# Patient Record
Sex: Male | Born: 1960 | Race: Black or African American | Hispanic: No | Smoking: Never smoker
Health system: Southern US, Community
[De-identification: ages and names within clinical notes are randomized; demographics above are authoritative.]

## PROBLEM LIST (undated history)

## (undated) DIAGNOSIS — M25569 Pain in unspecified knee: Secondary | ICD-10-CM

## (undated) DIAGNOSIS — F1420 Cocaine dependence, uncomplicated: Secondary | ICD-10-CM

## (undated) DIAGNOSIS — G8929 Other chronic pain: Secondary | ICD-10-CM

## (undated) DIAGNOSIS — F4310 Post-traumatic stress disorder, unspecified: Secondary | ICD-10-CM

## (undated) DIAGNOSIS — M549 Dorsalgia, unspecified: Secondary | ICD-10-CM

## (undated) DIAGNOSIS — A159 Respiratory tuberculosis unspecified: Secondary | ICD-10-CM

## (undated) HISTORY — PX: KNEE SURGERY: SHX244

---

## 1993-08-14 DIAGNOSIS — A159 Respiratory tuberculosis unspecified: Secondary | ICD-10-CM

## 1993-08-14 HISTORY — DX: Respiratory tuberculosis unspecified: A15.9

## 2000-03-20 ENCOUNTER — Emergency Department (HOSPITAL_COMMUNITY): Admission: EM | Admit: 2000-03-20 | Discharge: 2000-03-20 | Payer: Self-pay | Admitting: Emergency Medicine

## 2000-03-20 ENCOUNTER — Encounter: Payer: Self-pay | Admitting: Emergency Medicine

## 2000-03-22 ENCOUNTER — Emergency Department (HOSPITAL_COMMUNITY): Admission: EM | Admit: 2000-03-22 | Discharge: 2000-03-22 | Payer: Self-pay | Admitting: Emergency Medicine

## 2000-04-16 ENCOUNTER — Emergency Department (HOSPITAL_COMMUNITY): Admission: EM | Admit: 2000-04-16 | Discharge: 2000-04-16 | Payer: Self-pay | Admitting: Emergency Medicine

## 2009-11-14 ENCOUNTER — Encounter: Payer: Self-pay | Admitting: Sports Medicine

## 2009-11-14 ENCOUNTER — Emergency Department (HOSPITAL_COMMUNITY): Admission: EM | Admit: 2009-11-14 | Discharge: 2009-11-14 | Payer: Self-pay | Admitting: Emergency Medicine

## 2009-11-24 ENCOUNTER — Ambulatory Visit: Payer: Self-pay | Admitting: Internal Medicine

## 2009-11-24 ENCOUNTER — Encounter (INDEPENDENT_AMBULATORY_CARE_PROVIDER_SITE_OTHER): Payer: Self-pay | Admitting: Family Medicine

## 2009-11-24 LAB — CONVERTED CEMR LAB
Amphetamine Screen, Ur: NEGATIVE
Barbiturate Quant, Ur: NEGATIVE
Benzodiazepines.: NEGATIVE
Cocaine Metabolites: NEGATIVE
Creatinine,U: 122.1 mg/dL
Marijuana Metabolite: NEGATIVE
Methadone: NEGATIVE
Opiate Screen, Urine: NEGATIVE
Phencyclidine (PCP): NEGATIVE
Propoxyphene: NEGATIVE

## 2009-12-21 ENCOUNTER — Encounter (INDEPENDENT_AMBULATORY_CARE_PROVIDER_SITE_OTHER): Payer: Self-pay | Admitting: Family Medicine

## 2009-12-21 ENCOUNTER — Ambulatory Visit: Payer: Self-pay | Admitting: Internal Medicine

## 2009-12-21 LAB — CONVERTED CEMR LAB
ALT: 20 units/L (ref 0–53)
AST: 17 units/L (ref 0–37)
Albumin: 4.2 g/dL (ref 3.5–5.2)
Alkaline Phosphatase: 95 units/L (ref 39–117)
BUN: 13 mg/dL (ref 6–23)
Basophils Absolute: 0 10*3/uL (ref 0.0–0.1)
Basophils Relative: 0 % (ref 0–1)
CO2: 22 meq/L (ref 19–32)
Calcium: 9.2 mg/dL (ref 8.4–10.5)
Chloride: 105 meq/L (ref 96–112)
Cholesterol: 131 mg/dL (ref 0–200)
Creatinine, Ser: 1.16 mg/dL (ref 0.40–1.50)
Eosinophils Absolute: 0.1 10*3/uL (ref 0.0–0.7)
Eosinophils Relative: 2 % (ref 0–5)
Glucose, Bld: 95 mg/dL (ref 70–99)
HCT: 41.5 % (ref 39.0–52.0)
HDL: 56 mg/dL (ref >39)
Helicobacter Pylori Antibody-IgG: >8 — ABNORMAL HIGH
Hemoglobin: 13.5 g/dL (ref 13.0–17.0)
LDL Cholesterol: 47 mg/dL (ref 0–99)
Lymphocytes Relative: 32 % (ref 12–46)
Lymphs Abs: 1.5 10*3/uL (ref 0.7–4.0)
MCHC: 32.5 g/dL (ref 30.0–36.0)
MCV: 94.1 fL (ref 78.0–100.0)
Monocytes Absolute: 0.3 10*3/uL (ref 0.1–1.0)
Monocytes Relative: 6 % (ref 3–12)
Neutro Abs: 2.8 10*3/uL (ref 1.7–7.7)
Neutrophils Relative %: 60 % (ref 43–77)
Platelets: 233 10*3/uL (ref 150–400)
Potassium: 4.3 meq/L (ref 3.5–5.3)
RBC: 4.41 M/uL (ref 4.22–5.81)
RDW: 14.7 % (ref 11.5–15.5)
Sodium: 140 meq/L (ref 135–145)
Total Bilirubin: 0.3 mg/dL (ref 0.3–1.2)
Total CHOL/HDL Ratio: 2.3
Total Protein: 6.8 g/dL (ref 6.0–8.3)
Triglycerides: 138 mg/dL (ref <150)
VLDL: 28 mg/dL (ref 0–40)
Vit D, 25-Hydroxy: 29 ng/mL — ABNORMAL LOW (ref 30–89)
WBC: 4.7 10*3/uL (ref 4.0–10.5)

## 2009-12-22 ENCOUNTER — Ambulatory Visit: Payer: Self-pay | Admitting: Sports Medicine

## 2009-12-22 DIAGNOSIS — M25569 Pain in unspecified knee: Secondary | ICD-10-CM

## 2009-12-22 DIAGNOSIS — S82009A Unspecified fracture of unspecified patella, initial encounter for closed fracture: Secondary | ICD-10-CM

## 2010-02-15 ENCOUNTER — Ambulatory Visit: Payer: Self-pay | Admitting: Internal Medicine

## 2010-02-15 ENCOUNTER — Encounter (INDEPENDENT_AMBULATORY_CARE_PROVIDER_SITE_OTHER): Payer: Self-pay | Admitting: Family Medicine

## 2010-02-15 LAB — CONVERTED CEMR LAB
Chlamydia, Swab/Urine, PCR: NEGATIVE
GC Probe Amp, Urine: NEGATIVE

## 2010-08-09 ENCOUNTER — Encounter: Payer: Self-pay | Admitting: Family Medicine

## 2010-09-15 NOTE — Letter (Signed)
Summary: [facility]   Imported By: Marily Memos 08/15/2010 10:00:00  _____________________________________________________________________  External Attachment:    Type:   Image     Comment:   External Document
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
## 2011-03-08 ENCOUNTER — Emergency Department (HOSPITAL_COMMUNITY)
Admission: EM | Admit: 2011-03-08 | Discharge: 2011-03-08 | Disposition: A | Discharge status: Home / Self Care | Payer: Medicaid Other | Attending: Emergency Medicine | Admitting: Emergency Medicine

## 2011-03-08 ENCOUNTER — Emergency Department (HOSPITAL_COMMUNITY): Payer: Medicaid Other

## 2011-03-08 DIAGNOSIS — M79609 Pain in unspecified limb: Secondary | ICD-10-CM | POA: Insufficient documentation

## 2011-03-08 DIAGNOSIS — F3289 Other specified depressive episodes: Secondary | ICD-10-CM | POA: Insufficient documentation

## 2011-03-08 DIAGNOSIS — K219 Gastro-esophageal reflux disease without esophagitis: Secondary | ICD-10-CM | POA: Insufficient documentation

## 2011-03-08 DIAGNOSIS — R609 Edema, unspecified: Secondary | ICD-10-CM | POA: Insufficient documentation

## 2011-03-08 DIAGNOSIS — M25476 Effusion, unspecified foot: Secondary | ICD-10-CM | POA: Insufficient documentation

## 2011-03-08 DIAGNOSIS — R0602 Shortness of breath: Secondary | ICD-10-CM | POA: Insufficient documentation

## 2011-03-08 DIAGNOSIS — M25473 Effusion, unspecified ankle: Secondary | ICD-10-CM | POA: Insufficient documentation

## 2011-03-08 DIAGNOSIS — M7989 Other specified soft tissue disorders: Secondary | ICD-10-CM | POA: Insufficient documentation

## 2011-03-08 DIAGNOSIS — F329 Major depressive disorder, single episode, unspecified: Secondary | ICD-10-CM | POA: Insufficient documentation

## 2011-03-08 LAB — CBC
Hemoglobin: 14.4 g/dL (ref 13.0–17.0)
MCV: 89 fL (ref 78.0–100.0)
Platelets: 224 10*3/uL (ref 150–400)
RBC: 4.56 MIL/uL (ref 4.22–5.81)
WBC: 6.3 10*3/uL (ref 4.0–10.5)

## 2011-03-08 LAB — DIFFERENTIAL
Eosinophils Absolute: 0.3 10*3/uL (ref 0.0–0.7)
Lymphs Abs: 1.5 10*3/uL (ref 0.7–4.0)
Neutro Abs: 4 10*3/uL (ref 1.7–7.7)
Neutrophils Relative %: 63 % (ref 43–77)

## 2011-03-08 LAB — COMPREHENSIVE METABOLIC PANEL
ALT: 22 U/L (ref 0–53)
AST: 18 U/L (ref 0–37)
CO2: 28 mEq/L (ref 19–32)
Chloride: 101 mEq/L (ref 96–112)
Creatinine, Ser: 1 mg/dL (ref 0.50–1.35)
GFR calc non Af Amer: >60 mL/min (ref >60)
Sodium: 137 mEq/L (ref 135–145)
Total Bilirubin: 0.4 mg/dL (ref 0.3–1.2)

## 2011-03-09 ENCOUNTER — Emergency Department (HOSPITAL_COMMUNITY): Payer: No Typology Code available for payment source

## 2011-03-09 ENCOUNTER — Emergency Department (HOSPITAL_COMMUNITY)
Admission: EM | Admit: 2011-03-09 | Discharge: 2011-03-09 | Disposition: A | Discharge status: Home / Self Care | Payer: No Typology Code available for payment source | Attending: Emergency Medicine | Admitting: Emergency Medicine

## 2011-03-09 DIAGNOSIS — S0990XA Unspecified injury of head, initial encounter: Secondary | ICD-10-CM | POA: Insufficient documentation

## 2011-03-09 DIAGNOSIS — M542 Cervicalgia: Secondary | ICD-10-CM | POA: Insufficient documentation

## 2011-03-09 DIAGNOSIS — M549 Dorsalgia, unspecified: Secondary | ICD-10-CM | POA: Insufficient documentation

## 2011-03-09 DIAGNOSIS — IMO0002 Reserved for concepts with insufficient information to code with codable children: Secondary | ICD-10-CM | POA: Insufficient documentation

## 2011-03-11 ENCOUNTER — Emergency Department (HOSPITAL_COMMUNITY): Payer: No Typology Code available for payment source

## 2011-03-11 ENCOUNTER — Emergency Department (HOSPITAL_COMMUNITY)
Admission: EM | Admit: 2011-03-11 | Discharge: 2011-03-11 | Disposition: A | Discharge status: Home / Self Care | Payer: No Typology Code available for payment source | Attending: Emergency Medicine | Admitting: Emergency Medicine

## 2011-03-11 DIAGNOSIS — IMO0002 Reserved for concepts with insufficient information to code with codable children: Secondary | ICD-10-CM | POA: Insufficient documentation

## 2011-03-11 DIAGNOSIS — R0609 Other forms of dyspnea: Secondary | ICD-10-CM | POA: Insufficient documentation

## 2011-03-11 DIAGNOSIS — M549 Dorsalgia, unspecified: Secondary | ICD-10-CM | POA: Insufficient documentation

## 2011-03-11 DIAGNOSIS — R609 Edema, unspecified: Secondary | ICD-10-CM | POA: Insufficient documentation

## 2011-03-11 DIAGNOSIS — R011 Cardiac murmur, unspecified: Secondary | ICD-10-CM | POA: Insufficient documentation

## 2011-03-11 DIAGNOSIS — M79609 Pain in unspecified limb: Secondary | ICD-10-CM | POA: Insufficient documentation

## 2011-03-11 DIAGNOSIS — R071 Chest pain on breathing: Secondary | ICD-10-CM | POA: Insufficient documentation

## 2011-03-11 DIAGNOSIS — R0989 Other specified symptoms and signs involving the circulatory and respiratory systems: Secondary | ICD-10-CM | POA: Insufficient documentation

## 2011-03-11 LAB — BASIC METABOLIC PANEL
BUN: 12 mg/dL (ref 6–23)
CO2: 25 mEq/L (ref 19–32)
Chloride: 106 mEq/L (ref 96–112)
Creatinine, Ser: 1 mg/dL (ref 0.50–1.35)

## 2011-03-11 LAB — CBC
MCH: 30.4 pg (ref 26.0–34.0)
MCHC: 33.9 g/dL (ref 30.0–36.0)
Platelets: 243 10*3/uL (ref 150–400)

## 2011-03-11 LAB — DIFFERENTIAL
Basophils Relative: 0 % (ref 0–1)
Eosinophils Absolute: 0.5 10*3/uL (ref 0.0–0.7)
Eosinophils Relative: 7 % — ABNORMAL HIGH (ref 0–5)
Monocytes Absolute: 0.6 10*3/uL (ref 0.1–1.0)
Monocytes Relative: 9 % (ref 3–12)

## 2011-03-11 LAB — TROPONIN I: Troponin I: <0.3 ng/mL (ref <0.30)

## 2011-03-11 LAB — D-DIMER, QUANTITATIVE: D-Dimer, Quant: 0.53 ug/mL-FEU — ABNORMAL HIGH (ref 0.00–0.48)

## 2011-03-11 MED ORDER — IOHEXOL 300 MG/ML  SOLN: 100.0000 mL | Freq: Once | INTRAMUSCULAR | Status: DC | PRN

## 2011-03-22 ENCOUNTER — Emergency Department (HOSPITAL_COMMUNITY)
Admission: EM | Admit: 2011-03-22 | Discharge: 2011-03-22 | Disposition: A | Discharge status: Home / Self Care | Payer: No Typology Code available for payment source | Attending: Emergency Medicine | Admitting: Emergency Medicine

## 2011-03-22 DIAGNOSIS — M545 Low back pain, unspecified: Secondary | ICD-10-CM | POA: Insufficient documentation

## 2011-03-22 DIAGNOSIS — IMO0002 Reserved for concepts with insufficient information to code with codable children: Secondary | ICD-10-CM | POA: Insufficient documentation

## 2011-03-22 DIAGNOSIS — F3130 Bipolar disorder, current episode depressed, mild or moderate severity, unspecified: Secondary | ICD-10-CM | POA: Insufficient documentation

## 2011-03-22 DIAGNOSIS — S20229A Contusion of unspecified back wall of thorax, initial encounter: Secondary | ICD-10-CM | POA: Insufficient documentation

## 2011-03-22 DIAGNOSIS — Z79899 Other long term (current) drug therapy: Secondary | ICD-10-CM | POA: Insufficient documentation

## 2012-03-16 ENCOUNTER — Inpatient Hospital Stay (HOSPITAL_COMMUNITY)
Admission: EM | Admit: 2012-03-16 | Discharge: 2012-03-18 | DRG: 204 | Payer: Medicaid Other | Attending: Internal Medicine | Admitting: Internal Medicine

## 2012-03-16 ENCOUNTER — Encounter (HOSPITAL_COMMUNITY): Payer: Self-pay | Admitting: *Deleted

## 2012-03-16 ENCOUNTER — Emergency Department (HOSPITAL_COMMUNITY): Payer: Medicaid Other

## 2012-03-16 ENCOUNTER — Other Ambulatory Visit (HOSPITAL_COMMUNITY): Payer: Medicaid Other

## 2012-03-16 ENCOUNTER — Inpatient Hospital Stay (HOSPITAL_COMMUNITY): Payer: Medicaid Other

## 2012-03-16 DIAGNOSIS — J069 Acute upper respiratory infection, unspecified: Secondary | ICD-10-CM | POA: Diagnosis present

## 2012-03-16 DIAGNOSIS — Z5900 Homelessness unspecified: Secondary | ICD-10-CM

## 2012-03-16 DIAGNOSIS — R05 Cough: Secondary | ICD-10-CM

## 2012-03-16 DIAGNOSIS — F4310 Post-traumatic stress disorder, unspecified: Secondary | ICD-10-CM | POA: Diagnosis present

## 2012-03-16 DIAGNOSIS — F329 Major depressive disorder, single episode, unspecified: Secondary | ICD-10-CM | POA: Diagnosis present

## 2012-03-16 DIAGNOSIS — F1410 Cocaine abuse, uncomplicated: Secondary | ICD-10-CM | POA: Diagnosis present

## 2012-03-16 DIAGNOSIS — Z8611 Personal history of tuberculosis: Secondary | ICD-10-CM

## 2012-03-16 DIAGNOSIS — F1020 Alcohol dependence, uncomplicated: Secondary | ICD-10-CM | POA: Diagnosis present

## 2012-03-16 DIAGNOSIS — F3289 Other specified depressive episodes: Secondary | ICD-10-CM | POA: Diagnosis present

## 2012-03-16 DIAGNOSIS — R071 Chest pain on breathing: Principal | ICD-10-CM | POA: Diagnosis present

## 2012-03-16 DIAGNOSIS — R079 Chest pain, unspecified: Secondary | ICD-10-CM

## 2012-03-16 HISTORY — DX: Respiratory tuberculosis unspecified: A15.9

## 2012-03-16 HISTORY — DX: Post-traumatic stress disorder, unspecified: F43.10

## 2012-03-16 LAB — CBC
HCT: 44.4 % (ref 39.0–52.0)
MCV: 89.5 fL (ref 78.0–100.0)
RDW: 14.2 % (ref 11.5–15.5)
WBC: 7.2 10*3/uL (ref 4.0–10.5)

## 2012-03-16 LAB — COMPREHENSIVE METABOLIC PANEL
Albumin: 3.9 g/dL (ref 3.5–5.2)
BUN: 10 mg/dL (ref 6–23)
Chloride: 101 mEq/L (ref 96–112)
Creatinine, Ser: 1.05 mg/dL (ref 0.50–1.35)
GFR calc Af Amer: >90 mL/min (ref >90)
GFR calc non Af Amer: 80 mL/min — ABNORMAL LOW (ref >90)
Glucose, Bld: 93 mg/dL (ref 70–99)
Total Bilirubin: 0.5 mg/dL (ref 0.3–1.2)

## 2012-03-16 LAB — POCT I-STAT TROPONIN I: Troponin i, poc: 0 ng/mL (ref 0.00–0.08)

## 2012-03-16 LAB — CARDIAC PANEL(CRET KIN+CKTOT+MB+TROPI)
CK, MB: 4.3 ng/mL — ABNORMAL HIGH (ref 0.3–4.0)
CK, MB: 3.6 ng/mL (ref 0.3–4.0)
Troponin I: <0.3 ng/mL (ref <0.30)

## 2012-03-16 LAB — ETHANOL: Alcohol, Ethyl (B): 13 mg/dL — ABNORMAL HIGH (ref 0–11)

## 2012-03-16 MED ORDER — ACETAMINOPHEN 650 MG RE SUPP: 650.0000 mg | Freq: Four times a day (QID) | RECTAL | Status: DC | PRN

## 2012-03-16 MED ORDER — VITAMIN B-1 100 MG PO TABS
100.0000 mg | ORAL_TABLET | Freq: Every day | ORAL | Status: DC
  Administered 2012-03-16 – 2012-03-18 (×3): 100 mg via ORAL
  Filled 2012-03-16 (×3): qty 1

## 2012-03-16 MED ORDER — ASPIRIN 81 MG PO CHEW
CHEWABLE_TABLET | ORAL | Status: AC
  Administered 2012-03-16: 81 mg
  Filled 2012-03-16: qty 1

## 2012-03-16 MED ORDER — ASPIRIN EC 81 MG PO TBEC
81.0000 mg | DELAYED_RELEASE_TABLET | Freq: Every day | ORAL | Status: DC
  Administered 2012-03-17 – 2012-03-18 (×2): 81 mg via ORAL
  Filled 2012-03-16 (×2): qty 1

## 2012-03-16 MED ORDER — PANTOPRAZOLE SODIUM 40 MG PO TBEC
40.0000 mg | DELAYED_RELEASE_TABLET | Freq: Every day | ORAL | Status: DC
  Administered 2012-03-16 – 2012-03-17 (×2): 40 mg via ORAL
  Filled 2012-03-16 (×2): qty 1

## 2012-03-16 MED ORDER — ASPIRIN 81 MG PO CHEW: 81.0000 mg | CHEWABLE_TABLET | Freq: Once | ORAL | Status: DC

## 2012-03-16 MED ORDER — AZITHROMYCIN 250 MG PO TABS
500.0000 mg | ORAL_TABLET | Freq: Every day | ORAL | Status: AC
  Administered 2012-03-16: 500 mg via ORAL
  Filled 2012-03-16: qty 2

## 2012-03-16 MED ORDER — ALBUTEROL SULFATE (5 MG/ML) 0.5% IN NEBU: 2.5000 mg | INHALATION_SOLUTION | RESPIRATORY_TRACT | Status: DC | PRN

## 2012-03-16 MED ORDER — ALBUTEROL SULFATE HFA 108 (90 BASE) MCG/ACT IN AERS
2.0000 | INHALATION_SPRAY | Freq: Four times a day (QID) | RESPIRATORY_TRACT | Status: DC
  Filled 2012-03-16: qty 6.7

## 2012-03-16 MED ORDER — AZITHROMYCIN 250 MG PO TABS
250.0000 mg | ORAL_TABLET | Freq: Every day | ORAL | Status: DC
  Administered 2012-03-17 – 2012-03-18 (×2): 250 mg via ORAL
  Filled 2012-03-16 (×2): qty 1

## 2012-03-16 MED ORDER — ACETAMINOPHEN 325 MG PO TABS
650.0000 mg | ORAL_TABLET | Freq: Four times a day (QID) | ORAL | Status: DC | PRN
  Administered 2012-03-16: 650 mg via ORAL
  Filled 2012-03-16: qty 2

## 2012-03-16 MED ORDER — IOHEXOL 300 MG/ML  SOLN
80.0000 mL | Freq: Once | INTRAMUSCULAR | Status: AC | PRN
  Administered 2012-03-16: 80 mL via INTRAVENOUS

## 2012-03-16 MED ORDER — ASPIRIN 325 MG PO TABS
325.0000 mg | ORAL_TABLET | Freq: Once | ORAL | Status: DC
  Filled 2012-03-16: qty 1

## 2012-03-16 MED ORDER — ONDANSETRON HCL 4 MG PO TABS: 4.0000 mg | ORAL_TABLET | Freq: Four times a day (QID) | ORAL | Status: DC | PRN

## 2012-03-16 MED ORDER — NITROGLYCERIN 0.4 MG SL SUBL
SUBLINGUAL_TABLET | SUBLINGUAL | Status: AC
  Administered 2012-03-16: 0.4 mg
  Administered 2012-03-16: 0.4 mg via SUBLINGUAL
  Administered 2012-03-16: 0.4 mg
  Filled 2012-03-16: qty 25

## 2012-03-16 MED ORDER — ONDANSETRON HCL 4 MG/2ML IJ SOLN: 4.0000 mg | Freq: Four times a day (QID) | INTRAMUSCULAR | Status: DC | PRN

## 2012-03-16 MED ORDER — SODIUM CHLORIDE 0.9 % IV SOLN
INTRAVENOUS | Status: AC
  Administered 2012-03-16: 09:00:00 via INTRAVENOUS

## 2012-03-16 MED ORDER — NITROGLYCERIN 0.4 MG SL SUBL
SUBLINGUAL_TABLET | SUBLINGUAL | Status: AC
  Filled 2012-03-16: qty 25

## 2012-03-16 MED ORDER — ENOXAPARIN SODIUM 40 MG/0.4ML ~~LOC~~ SOLN
40.0000 mg | Freq: Every day | SUBCUTANEOUS | Status: DC
  Administered 2012-03-16 – 2012-03-17 (×2): 40 mg via SUBCUTANEOUS
  Filled 2012-03-16 (×3): qty 0.4

## 2012-03-16 MED ORDER — ALBUTEROL SULFATE HFA 108 (90 BASE) MCG/ACT IN AERS
2.0000 | INHALATION_SPRAY | Freq: Four times a day (QID) | RESPIRATORY_TRACT | Status: DC
  Administered 2012-03-16: 2 via RESPIRATORY_TRACT

## 2012-03-16 NOTE — ED Provider Notes (Signed)
EKG interpretation   Date: 03/16/2012  Rate: 86  Rhythm: normal sinus rhythm  QRS Axis: normal  Intervals: normal  ST/T Wave abnormalities: normal  Conduction Disutrbances:none  Old EKG Reviewed: unchanged   Richardean Canal, MD 03/16/12 [phone]

## 2012-03-16 NOTE — ED Notes (Signed)
Attempted to call report x2, states they will call back in .

## 2012-03-16 NOTE — ED Notes (Signed)
Attempted to call report x1, stated they would call back 

## 2012-03-16 NOTE — Progress Notes (Signed)
Received pt from ED per w/c. Admitted to room 4702. PLaced on telemetry monitor & oriented to room. Explained to pt to call RN if he has chest pain placed on chest pain careplan.

## 2012-03-16 NOTE — H&P (Addendum)
Ian Wolf is an 50 y.o. male.    PCP: Does not have a PCP  Chief Complaint: Chest pain, and cough  HPI: This is a 51 year old, African American male, with a past medical history of tuberculosis, which he says was diagnosed in 1994, for which he took medications for more than 6 months. And also has a history of depression, for which he is no longer taking any medications. He also has some chronic back and knee pain. He is homeless. He was sitting on a park bench when he started having pain in the chest. He describes it as a sharp pain without any radiation to any other site. It was 7/10 in intensity and would worsen with deep breathing. He is unable to tell me if he had a fever or not but has been having a lot of sweating. Has been having some shortness of breath. He's had a cough for the last couple of weeks now with greenish expectoration, which he states its getting better. He's had a few episodes of nausea, vomiting, a few days ago, but none currently. Has been a little dizzy, but denies any syncopal episodes. Denies any weight loss. The TB was diagnosed when he was in prison. Since then he has been incarcerated a few times.   Home Medications: Prior to Admission medications   Not on File    Allergies: No Known Allergies  Past Medical History: Past Medical History  Diagnosis Date  . Post traumatic stress disorder (PTSD)   . Tuberculosis 1995    Past Surgical History  Procedure Date  . Knee surgery     L    Social History:  reports that he has never smoked. He does not have any smokeless tobacco history on file. He reports that he drinks about .6 ounces of alcohol per week. He reports that he does not use illicit drugs.  Family History: There is history of strokes and heart attack, and high blood pressure in the family.  Review of Systems - History obtained from the patient General ROS: positive for  - malaise Psychological ROS: negative Ophthalmic ROS: negative ENT  ROS: negative Allergy and Immunology ROS: negative Hematological and Lymphatic ROS: negative Endocrine ROS: negative Respiratory ROS: as in hpi Cardiovascular ROS: as in hpi Gastrointestinal ROS: no abdominal pain, change in bowel habits, or black or bloody stools Genito-Urinary ROS: no dysuria, trouble voiding, or hematuria Musculoskeletal ROS: negative Neurological ROS: no TIA or stroke symptoms Dermatological ROS: negative  Physical Examination Blood pressure 111/76, pulse 98, temperature 98.6 F (37 C), temperature source Oral, resp. rate 16, height 6' (1.829 m), weight 104.327 kg (230 lb), SpO2 98.00%.  General appearance: alert, cooperative, appears stated age and no distress Head: Normocephalic, without obvious abnormality, atraumatic Eyes: conjunctivae/corneas clear. PERRL, EOM's intact.  Throat: lips, mucosa, and tongue normal; teeth and gums normal Neck: no adenopathy, no carotid bruit, no JVD, supple, symmetrical, trachea midline and thyroid not enlarged, symmetric, no tenderness/mass/nodules Back: symmetric, no curvature. ROM normal. No CVA tenderness. Resp: clear to auscultation bilaterally Cardio: regular rate and rhythm, S1, S2 normal, no murmur, click, rub or gallop GI: soft, non-tender; bowel sounds normal; no masses,  no organomegaly Extremities: extremities normal, atraumatic, no cyanosis or edema Pulses: 2+ and symmetric Skin: Skin color, texture, turgor normal. No rashes or lesions Lymph nodes: Cervical, supraclavicular, and axillary nodes normal. Neurologic: Grossly normal  Laboratory Data: Results for orders placed during the hospital encounter of 03/16/12 (from the past 48 hour(s))  CBC     Status: Normal   Collection Time   03/16/12  6:08 AM      Component Value Range Comment   WBC 7.2  4.0 - 10.5 K/uL    RBC 4.96  4.22 - 5.81 MIL/uL    Hemoglobin 15.5  13.0 - 17.0 g/dL    HCT 84.6  96.2 - 95.2 %    MCV 89.5  78.0 - 100.0 fL    MCH 31.3  26.0 - 34.0 pg     MCHC 34.9  30.0 - 36.0 g/dL    RDW 84.1  32.4 - 40.1 %    Platelets 264  150 - 400 K/uL   COMPREHENSIVE METABOLIC PANEL     Status: Abnormal   Collection Time   03/16/12  6:08 AM      Component Value Range Comment   Sodium 138  135 - 145 mEq/L    Potassium 4.1  3.5 - 5.1 mEq/L    Chloride 101  96 - 112 mEq/L    CO2 25  19 - 32 mEq/L    Glucose, Bld 93  70 - 99 mg/dL    BUN 10  6 - 23 mg/dL    Creatinine, Ser 0.27  0.50 - 1.35 mg/dL    Calcium 9.4  8.4 - 25.3 mg/dL    Total Protein 7.2  6.0 - 8.3 g/dL    Albumin 3.9  3.5 - 5.2 g/dL    AST 21  0 - 37 U/L    ALT 21  0 - 53 U/L    Alkaline Phosphatase 86  39 - 117 U/L    Total Bilirubin 0.5  0.3 - 1.2 mg/dL    GFR calc non Af Amer 80 (*) >90 mL/min    GFR calc Af Amer >90  >90 mL/min   POCT I-STAT TROPONIN I     Status: Normal   Collection Time   03/16/12  6:10 AM      Component Value Range Comment   Troponin i, poc 0.00  0.00 - 0.08 ng/mL    Comment 3            ETHANOL     Status: Abnormal   Collection Time   03/16/12  7:50 AM      Component Value Range Comment   Alcohol, Ethyl (B) 13 (*) 0 - 11 mg/dL     Radiology Reports: Dg Chest 2 View  03/16/2012  *RADIOLOGY REPORT*  Clinical Data: Left-sided chest pain.  Numbness in the left hand.  CHEST - 2 VIEW  Comparison: 03/11/2011  Findings: The heart size and pulmonary vascularity are normal. The lungs appear clear and expanded without focal air space disease or consolidation. No blunting of the costophrenic angles.  No pneumothorax.  No significant change since previous study.  IMPRESSION: No evidence of active pulmonary disease.  Original Report Authenticated By: Marlon Pel, M.D.    Electrocardiogram: EKG shows sinus rhythm at 86 beats per minute. Normal axis. Intervals are normal. No Q waves. No concerning ST or T-wave changes are noted.  Assessment/Plan  Principal Problem:  *Chest pain Active Problems:  Cough  History of TB (tuberculosis)   #1 chest pain: This  appears to be pleuritic. Patient may have bronchitis. EKG was nonischemic. We will rule him out for acute coronary syndrome by serial cardiac enzymes. Aspirin will be continued for now. If his cardiac enzymes are all negative he will require no further workup at this time.  #2  cough with history of tuberculosis: He doesn't have any infiltrate on his chest x-ray and he is afebrile. We'll get a CT scan to make, sure there is no occult infiltrate. At this time we will treat him as if this is bronchitis and start Zithromycin. Hold off on steroids. If he does have an infiltrate on chest CT further evaluation for tuberculosis may be necessary. Have discussed the above with the Dr. Luciana Axe with infectious disease. Albuterol inhaler will be utilized. The interferon gold assay test has been sent but its likely it will be positive considering his history.  He is a full code. DVT, prophylaxis will utilized.  Social worker will be consulted to assist this patient as he is homeless.  If the CT chest is negative his respiratory isolation can be discontinued.  Further management decisions will depend on results of further testing and patient's response to treatment  [facility]  [facility] Pager [phone]  03/16/2012, 9:13 AM    Ct Chest does not reveal any infiltrates. May discontinue isolation. Continue other treatment as outlined.  Gaila Engebretsen 11:24 AM

## 2012-03-16 NOTE — ED Notes (Signed)
Pt called EMS for chest pain that started at 0400, L palpable chest, non-radiating.  Pt was sitting on a park bench when pain began.  Presently pt sleeping (per ems pt slept the entire trip).  VS stable.
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
## 2012-03-16 NOTE — ED Provider Notes (Signed)
Medical screening examination/treatment/procedure(s) were conducted as a shared visit with non-physician practitioner(s) and myself.  I personally evaluated the patient during the encounter  Patient is a 51 yo homeless male with hx of TB and recent incarceration in 2011 here with CP and cough. Occasional cough x 2 weeks, ? Night sweats, today was coughing brownish and rusty colored sputum so called EMS. He also had L sided CP with ? Exertional component. No CAD in the past. He stated that this is similar to his previous TB.   PEX- Disheveled, alert, not intoxicated. HEENT- nl, head non traumatic. RRR, CTAB. Ab soft, no peripheral edema.  Labs- nl labs, trop x 1 neg. Quantiferon sent.  CXR- nl no cavities.   MDM 51 yo M with CP and cough concerning for TB vs acs. Will need admission pending quantiferon. Will need tele to r/o acs. Discussed with PA.  Richardean Canal, MD 03/16/12 [phone]

## 2012-03-16 NOTE — ED Notes (Signed)
Patient transported to CT 

## 2012-03-16 NOTE — Progress Notes (Signed)
Pt c/o chest pain Lt side onscale of 1 to 10 rated it an 8. 12 lead EKG done VSS BP 125/71 NSR 92 RR 16 02 sat 97 %. Given 1 SL NTG T [employer]

## 2012-03-16 NOTE — ED Provider Notes (Signed)
History     CSN: [id number]  Arrival date & time 03/16/12  0525   First MD Initiated Contact with Patient 03/16/12 [phone]      Chief Complaint  Patient presents with  . Chest Pain    (Consider location/radiation/quality/duration/timing/severity/associated sxs/prior treatment) HPI Comments: 51 y/o male presents with sudden onset left sided chest pain while sitting on a park bench at 4am this morning. Rates pain 8/10 and is non radiating. Denies ever having chest pain before. Pain is constant and describes it as "tingling". Admits to mild SOB on exertion only. Felt a little nauseated before the chest pain began but this has subsided. Denies fever, chills, diaphoresis, vomiting, cough. Denies any history of heart disease. He thinks his parents may have had high blood pressure but he is not sure. Denies any recent alcohol or drug use. Patient is a poor historian. When asked later about symptoms, he admits to cough on and off for about 2 weeks. The past few days his sputum has been rust colored. Admits to TB diagnosis in 1994 when he was incarcerated. Recently incarcerated in 2011. When asking where he lives, he states in [place], but he likes to wander to [place] occasionally.   The history is provided by the patient. The history is limited by the condition of the patient (patient drowsy).    Past Medical History  Diagnosis Date  . Post traumatic stress disorder (PTSD)   . Tuberculosis 1995    Past Surgical History  Procedure Date  . Knee surgery     L    No family history on file.  History  Substance Use Topics  . Smoking status: Never Smoker   . Smokeless tobacco: Not on file  . Alcohol Use: 0.6 oz/week    1 Cans of beer per week      Review of Systems  Constitutional: Negative for fever and chills.  Respiratory: Positive for cough and shortness of breath.   Cardiovascular: Positive for chest pain.  Gastrointestinal: Positive for nausea. Negative for vomiting.    Skin: Negative for rash.  Neurological: Negative for weakness and numbness.    Allergies  Review of patient's allergies indicates no known allergies.  Home Medications  No current outpatient prescriptions on file.  BP 111/76  Pulse 98  Temp 98.6 F (37 C) (Oral)  Resp 16  Ht 6' (1.829 m)  Wt 230 lb (104.327 kg)  BMI 31.19 kg/m2  SpO2 98%  Physical Exam  Constitutional: He is oriented to person, place, and time. Vital signs are normal. He is sleeping.  HENT:  Head: Normocephalic and atraumatic.  Mouth/Throat: Uvula is midline, oropharynx is clear and moist and mucous membranes are normal.  Eyes: Conjunctivae and EOM are normal. Pupils are equal, round, and reactive to light.  Neck: Neck supple.  Cardiovascular: Normal rate, regular rhythm and normal heart sounds.   Pulmonary/Chest: Effort normal and breath sounds normal. No respiratory distress. He has no wheezes. He has no rhonchi. He has no rales.  Abdominal: Soft. Normal appearance and bowel sounds are normal. There is no tenderness.  Neurological: He is alert and oriented to person, place, and time.  Skin: Skin is warm and dry. No rash noted.  Psychiatric: His affect is blunt. His speech is delayed. He is slowed.    ED Course  Procedures (including critical care time)  Labs Reviewed  COMPREHENSIVE METABOLIC PANEL - Abnormal; Notable for the following:    GFR calc non Af Amer 80 (*)  All other components within normal limits  CBC  POCT I-STAT TROPONIN I   Dg Chest 2 View  03/16/2012  *RADIOLOGY REPORT*  Clinical Data: Left-sided chest pain.  Numbness in the left hand.  CHEST - 2 VIEW  Comparison: 03/11/2011  Findings: The heart size and pulmonary vascularity are normal. The lungs appear clear and expanded without focal air space disease or consolidation. No blunting of the costophrenic angles.  No pneumothorax.  No significant change since previous study.  IMPRESSION: No evidence of active pulmonary disease.   Original Report Authenticated By: Marlon Pel, M.D.   Date: 03/16/2012  Rate: 86  Rhythm: normal sinus rhythm  QRS Axis: normal  Intervals: normal  ST/T Wave abnormalities: normal  Conduction Disutrbances:none  Old EKG Reviewed: unchanged     Dx- chest pain, hx of TB, cough.   MDM  51 y/o male with sudden onset chest pain with history of TB. Admits to coughing up rust colored sputum. CXR normal. Labs normal. Troponin negative. Due to history of TB and patient being a poor historian, Dr. Silverio Lay and myself feel admission is next best step for patient.        Trevor Mace, PA-C 03/16/12 [phone]

## 2012-03-16 NOTE — ED Notes (Signed)
Pt reports non radiating (L) side chest pain, sob, nausea, weakness, headache, productive cough w/green colored sputum and diaphoretic x1 hr. Pt denies back/arm/abd pain, fevers or dizziness. Pt reports his symptoms are similar to the time he had TB in 1995.

## 2012-03-16 NOTE — Progress Notes (Signed)
RN from ED called 2nd time and report taken immediately 1st time she called I was ina patient room giving insulin . Marisa Cyphers RN

## 2012-03-16 NOTE — Progress Notes (Signed)
Pt has had total 3 NTG SL stated pain now a "6" out of 10 c/o aching Lt chest Had to wake pt up to ask him pain level 02 sat at 97 % Call to MD to notify of event  T [facility] RN

## 2012-03-17 DIAGNOSIS — J069 Acute upper respiratory infection, unspecified: Secondary | ICD-10-CM | POA: Diagnosis present

## 2012-03-17 DIAGNOSIS — F1420 Cocaine dependence, uncomplicated: Secondary | ICD-10-CM

## 2012-03-17 DIAGNOSIS — F1020 Alcohol dependence, uncomplicated: Secondary | ICD-10-CM

## 2012-03-17 DIAGNOSIS — F4321 Adjustment disorder with depressed mood: Secondary | ICD-10-CM

## 2012-03-17 LAB — CBC
HCT: 39.1 % (ref 39.0–52.0)
MCHC: 34.5 g/dL (ref 30.0–36.0)
MCV: 90.7 fL (ref 78.0–100.0)
RDW: 14.1 % (ref 11.5–15.5)

## 2012-03-17 LAB — BASIC METABOLIC PANEL
BUN: 17 mg/dL (ref 6–23)
CO2: 23 mEq/L (ref 19–32)
Chloride: 103 mEq/L (ref 96–112)
Creatinine, Ser: 1.21 mg/dL (ref 0.50–1.35)
Glucose, Bld: 111 mg/dL — ABNORMAL HIGH (ref 70–99)

## 2012-03-17 LAB — HIV ANTIBODY (ROUTINE TESTING W REFLEX): HIV: NONREACTIVE

## 2012-03-17 LAB — CARDIAC PANEL(CRET KIN+CKTOT+MB+TROPI): Total CK: 258 U/L — ABNORMAL HIGH (ref 7–232)

## 2012-03-17 LAB — RAPID URINE DRUG SCREEN, HOSP PERFORMED: Benzodiazepines: NOT DETECTED

## 2012-03-17 MED ORDER — RANITIDINE HCL 150 MG PO CAPS: 150.0000 mg | ORAL_CAPSULE | Freq: Two times a day (BID) | ORAL | Status: DC

## 2012-03-17 MED ORDER — ALBUTEROL SULFATE HFA 108 (90 BASE) MCG/ACT IN AERS: 2.0000 | INHALATION_SPRAY | Freq: Four times a day (QID) | RESPIRATORY_TRACT | Status: DC | PRN

## 2012-03-17 MED ORDER — POTASSIUM CHLORIDE CRYS ER 20 MEQ PO TBCR
40.0000 meq | EXTENDED_RELEASE_TABLET | Freq: Once | ORAL | Status: AC
  Administered 2012-03-17: 40 meq via ORAL
  Filled 2012-03-17: qty 2

## 2012-03-17 MED ORDER — FLUOXETINE HCL 10 MG PO CAPS
10.0000 mg | ORAL_CAPSULE | Freq: Every day | ORAL | Status: DC
  Administered 2012-03-18: 10 mg via ORAL
  Filled 2012-03-17: qty 1

## 2012-03-17 MED ORDER — AZITHROMYCIN 250 MG PO TABS: ORAL_TABLET | ORAL | Status: AC

## 2012-03-17 NOTE — Progress Notes (Signed)
1130 seen by Marketing executive . Reported for  psychological concern  That he' s depressed , had not taken his psychiatric meds  And could  Hurt himself . Referred to Dr. Kathee Polite  Seen and evaluated the pt . No need for sitter . Ordered psychiatry consult. Charge rn  [place]

## 2012-03-17 NOTE — Progress Notes (Addendum)
Clinical Social Work Department BRIEF PSYCHOSOCIAL ASSESSMENT 03/17/2012  Patient:  Ian Wolf, Ian Wolf     Account Number:  [id number]     Admit date:  03/16/2012  Clinical Social Worker:  ,  Date/Time:  03/17/2012 11:20 AM  Referred by:  Physician  Date Referred:  03/16/2012 Referred for  Homelessness   Other Referral:   Interview type:  Patient Other interview type:    PSYCHOSOCIAL DATA Living Status:  OTHER -Homeless Primary support name:  N/A Degree of support available:   No support at this time. Patient is seeking support from several local family members.    CURRENT CONCERNS Current Concerns  Post-Acute Placement   Other Concerns:    SOCIAL WORK ASSESSMENT / PLAN CSW consulted by MD to discuss community resources ZO:[id number]. CSW provided the patient supportive counseling XB:[id number] living situation. CSW provided the patient with a bus pass, IRC and adult shelter information. The patient stated he was severely depressed by being homeless and a PTSD diagnosis that he is "getting close to suicide." CSW made a safety plan with the social worker, and stated he would come to the hospital if he was worried about harming himself. The patient stated he stopped taking his depression and anti-psychotice medication several weeks ago and stated he was interested in starting medication again as well as his local psychiatrist. Upon CSW leaving the room, the patient appeared calm, and safe. CSW consulted with the charge nurse, and RN covering his room about his suicidal ideation. RN stated she would contact his attending physician for possible behavioral health transfer. CSW signing off, no other psychosocial concerns at this time.   Assessment/plan status:  Information/Referral to [facility] Other assessment/ plan:  03/18/12 Unit CSW received call from floor RN regarding disposition for patient. CSW spoke with patient and provided resources for inpatient and outpatient treatment  centers for substance abuse and list of homeless shelters. Patient stated that he is awaiting an apartment at [facility] and he is needing his birth certificate, which the [facility] is assisting him. CSW made a call to [facility] [phone]) in [facility], regarding patient's stay at that facility and to inquire if he can return. CSW spoke with Isabell Jarvis and per Mr. Hyacinth Meeker, patient's last day is today and will have to wait 90 days before applying again. CSW spoke with Efraim Kaufmann that the [facility] in [place], and they are a first come, first serve bases. CSW relayed this information to patient and he believes that he may not be able to get in, because he had stayed there previously and is uncertain if he had used his days. CSW inquired about support systems in his life and patient stated that his parents and sister were his main supports, but they have passed. Patient stated that he has children and he has an "okay" relationship with his daughter. Patient stated that he thought about making a call to her, but he feels like he would be "bothering" her and does not want to do that to her. Patient reported multiple times, that there is a wait for everything and he feels that he cannot wait that long to be on the street before he receives help. Patient stated that he will try the [facility] at discharge. CSW offered resources and emotional support to patient. Information/referral to community resources:   03/18/12 Substance Abuse Treatment centers (inpatient/outpatient) List of homeless shelters  PATIENT'S/FAMILY'S RESPONSE TO PLAN OF CARE: Patient appreciative of CSW listening to his concerns. Thanked CSW  for listening.    Lia Foyer, LCSWA [facility] Clinical Social Worker Contact #: [phone] (weekend)

## 2012-03-17 NOTE — Progress Notes (Addendum)
PCP: He does not have a PCP  Brief HPI:  This is a 51 year old, African American male, with a past medical history of tuberculosis, which he says was diagnosed in 1994, for which he took medications for more than 6 months. And also has a history of depression, for which he is no longer taking any medications. He also has some chronic back and knee pain. He is homeless. On the day of admission he was sitting on a park bench when he started having pain in the chest. He describes it as a sharp pain without any radiation to any other site. It was 7/10 in intensity and would worsen with deep breathing. He was unable to tell if he had a fever or not but has been having a lot of sweating. Has been having some shortness of breath. He's had a cough for the last couple of weeks now with greenish expectoration, which he stated was getting better. He's had a few episodes of nausea, vomiting, a few days ago, but none currently. Had been a little dizzy, but denied any syncopal episodes. Denied any weight loss. The TB was diagnosed when he was in prison. Since then he has been incarcerated a few times.  Past medical history:  Past Medical History  Diagnosis Date  . Post traumatic stress disorder (PTSD)   . Tuberculosis 1995    Consultants: ID  Procedures: None  Subjective: Patient feels better. Still coughing some. Cheat pain is better and feels pain to touch. Breathing is better.  Objective: Vital signs in last 24 hours: Temp:  [98.1 F (36.7 C)-98.4 F (36.9 C)] 98.4 F (36.9 C) (08/04 0539) Pulse Rate:  [67-90] 67  (08/04 0539) Resp:  [13-21] 18  (08/04 0539) BP: (99-125)/(61-86) 115/65 mmHg (08/04 0539) SpO2:  [92 %-100 %] 100 % (08/04 0539) Weight:  [93.622 kg (206 lb 6.4 oz)-95.5 kg (210 lb 8.6 oz)] 95.5 kg (210 lb 8.6 oz) (08/04 0539) Weight change: -10.705 kg (-23 lb 9.6 oz) Last BM Date: 03/16/12  Intake/Output from previous day: 08/03 0701 - 08/04 0700 In: 1691.7 [P.O.:240;  I.V.:1451.7] Out: 650 [Urine:650] Intake/Output this shift: Total I/O In: -  Out: 525 [Urine:525]  Tele: No red alarms  General appearance: alert, cooperative, appears stated age and no distress Head: Normocephalic, without obvious abnormality, atraumatic Back: symmetric, no curvature. ROM normal. No CVA tenderness. Resp: clear to auscultation bilaterally Cardio: regular rate and rhythm, S1, S2 normal, no murmur, click, rub or gallop GI: soft, non-tender; bowel sounds normal; no masses,  no organomegaly Extremities: extremities normal, atraumatic, no cyanosis or edema Neurologic: Alert and oriented x 3. No focal deficits.  Lab Results:  Basename 03/17/12 0105 03/16/12 0608  WBC 6.5 7.2  HGB 13.5 15.5  HCT 39.1 44.4  PLT 212 264   BMET  Basename 03/17/12 0105 03/16/12 0608  NA 138 138  K 3.3* 4.1  CL 103 101  CO2 23 25  GLUCOSE 111* 93  BUN 17 10  CREATININE 1.21 1.05  CALCIUM 8.7 9.4  ALT -- 21    Studies/Results: Dg Chest 2 View  03/16/2012  *RADIOLOGY REPORT*  Clinical Data: Left-sided chest pain.  Numbness in the left hand.  CHEST - 2 VIEW  Comparison: 03/11/2011  Findings: The heart size and pulmonary vascularity are normal. The lungs appear clear and expanded without focal air space disease or consolidation. No blunting of the costophrenic angles.  No pneumothorax.  No significant change since previous study.  IMPRESSION: No evidence  of active pulmonary disease.  Original Report Authenticated By: Marlon Pel, M.D.   Ct Chest W Contrast  03/16/2012  *RADIOLOGY REPORT*  Clinical Data: Chest pain and shortness of breath.  Cough.  CT CHEST WITH CONTRAST  Technique:  Multidetector CT imaging of the chest was performed following the standard protocol during bolus administration of intravenous contrast.  Contrast: 80mL OMNIPAQUE IOHEXOL 300 MG/ML  SOLN  Comparison: 03/16/2012  Findings: No enlarged axillary or supraclavicular lymph nodes.  No enlarged mediastinal or  hilar lymph nodes.  No pericardial or pleural effusion identified.  The lungs appear clear.  No interstitial edema or airspace consolidation identified.  The lungs appear clear.  No airspace consolidation.  No pulmonary nodule or mass noted. Review of the visualized osseous structures is unremarkable.  No aggressive bone lesions identified.  Limited imaging through the upper abdomen is unremarkable.  No worrisome lytic or sclerotic bone lesions are identified.  IMPRESSION:  1.  No active cardiopulmonary abnormalities. 2.  No evidence for acute or chronic granulomatous disease within the chest.  Original Report Authenticated By: Rosealee Albee, M.D.    Medications:  Scheduled:   . aspirin      . aspirin EC  81 mg Oral Daily  . azithromycin  500 mg Oral Daily   Followed by  . azithromycin  250 mg Oral Daily  . enoxaparin (LOVENOX) injection  40 mg Subcutaneous Daily  . nitroGLYCERIN      . nitroGLYCERIN      . pantoprazole  40 mg Oral Q1200  . potassium chloride  40 mEq Oral Once  . thiamine  100 mg Oral Daily  . DISCONTD: albuterol  2 puff Inhalation QID  . DISCONTD: albuterol  2 puff Inhalation QID  . DISCONTD: aspirin  81 mg Oral Once  . DISCONTD: aspirin  325 mg Oral Once   Continuous:   . sodium chloride 100 mL/hr at 03/16/12 2000   RUE:[id number], acetaminophen, albuterol, iohexol, ondansetron (ZOFRAN) IV, ondansetron  Assessment/Plan:  Principal Problem:  *Chest pain Active Problems:  Cough  History of TB (tuberculosis)  URTI (acute upper respiratory infection)    Pleuritic Chest pain Patient likely has URTI/bronchitis. EKG was nonischemic. He has ruled out for ACS. No PE seen on CT. Pain is better. Will treat with antibiotics and inhalers. No further work up necessary at this time.   Cough with history of tuberculosis He doesn't have any infiltrate on his chest x-ray or CT. Unlikely to be TB. Appreciate ID input. He likely has URTI. Treat with Z-pak and Albuterol  inhaler. No need for steroids.   Homeless CSW to address this. Perhaps he can get into a shelter. He will need assistance with medications as well.  Code Status He is a full code.   DVT, prophylaxis With Enoxaparin  Disposition: Per CSW. He is stable for discharge.   LOS: 1 day   [facility]  [facility] Pager [phone] 03/17/2012, 7:50 AM   Patient told CSW that he was depressed and has thought about ending his life. He denied any suicidal ideation to me. But did voice frustration with his life. He is tired of being homeless. He says he wants to go back to [facility] but cannot do that till tomorrow. Will involve Psychiatry to see him. Hold discharge for now.  Latorya Bautch 11:39 AM

## 2012-03-17 NOTE — Consult Note (Signed)
Reason for Consult: depression Referring Physician: unknown  Ian Wolf is an 51 y.o. male.  HPI: This is a 51 year old, African American male, with a past medical history of tuberculosis, which he says was diagnosed in 1994, for which he took medications for more than 6 months. He was sitting on a park bench when he started having pain in the chest. He describes it as a sharp pain without any radiation to any other site. It was 7/10 in intensity and would worsen with deep breathing.  Since then he has been incarcerated a few times.   And also has a history of depression with sad mood, low energy and poor sleep at time, for which he is no longer taking any medications for many moths now and thinks meds used to help for that.  He is homeless and living situation making more depressed these days. Has hx of trouble with law and was in prison few months ago for stealing charges. Use cocaine and ETOH frquently these days and thinks it is problems for him. Denies any SI, AVH or anxiety or other psychotic symptoms.   Past Medical History  Diagnosis Date  . Post traumatic stress disorder (PTSD)   . Tuberculosis 1995    Past Surgical History  Procedure Date  . Knee surgery     L    History reviewed. No pertinent family history.  Social History:  reports that he has never smoked. He does not have any smokeless tobacco history on file. He reports that he drinks about .6 ounces of alcohol per week. He reports that he does not use illicit drugs. homeless, legal issues in past  Allergies: No Known Allergies  Medications: I have reviewed the patient's current medications.  Results for orders placed during the hospital encounter of 03/16/12 (from the past 48 hour(s))  CBC     Status: Normal   Collection Time   03/16/12  6:08 AM      Component Value Range Comment   WBC 7.2  4.0 - 10.5 K/uL    RBC 4.96  4.22 - 5.81 MIL/uL    Hemoglobin 15.5  13.0 - 17.0 g/dL    HCT 82.9  56.2 - 13.0 %    MCV  89.5  78.0 - 100.0 fL    MCH 31.3  26.0 - 34.0 pg    MCHC 34.9  30.0 - 36.0 g/dL    RDW 86.5  78.4 - 69.6 %    Platelets 264  150 - 400 K/uL   COMPREHENSIVE METABOLIC PANEL     Status: Abnormal   Collection Time   03/16/12  6:08 AM      Component Value Range Comment   Sodium 138  135 - 145 mEq/L    Potassium 4.1  3.5 - 5.1 mEq/L    Chloride 101  96 - 112 mEq/L    CO2 25  19 - 32 mEq/L    Glucose, Bld 93  70 - 99 mg/dL    BUN 10  6 - 23 mg/dL    Creatinine, Ser 2.95  0.50 - 1.35 mg/dL    Calcium 9.4  8.4 - 28.4 mg/dL    Total Protein 7.2  6.0 - 8.3 g/dL    Albumin 3.9  3.5 - 5.2 g/dL    AST 21  0 - 37 U/L    ALT 21  0 - 53 U/L    Alkaline Phosphatase 86  39 - 117 U/L    Total Bilirubin 0.5  0.3 - 1.2 mg/dL    GFR calc non Af Amer 80 (*) >90 mL/min    GFR calc Af Amer >90  >90 mL/min   POCT I-STAT TROPONIN I     Status: Normal   Collection Time   03/16/12  6:10 AM      Component Value Range Comment   Troponin i, poc 0.00  0.00 - 0.08 ng/mL    Comment 3            ETHANOL     Status: Abnormal   Collection Time   03/16/12  7:50 AM      Component Value Range Comment   Alcohol, Ethyl (B) 13 (*) 0 - 11 mg/dL   TSH     Status: Normal   Collection Time   03/16/12  9:26 AM      Component Value Range Comment   TSH 1.391  0.350 - 4.500 uIU/mL   CARDIAC PANEL(CRET KIN+CKTOT+MB+TROPI)     Status: Abnormal   Collection Time   03/16/12  9:26 AM      Component Value Range Comment   Total CK 290 (*) 7 - 232 U/L    CK, MB 4.3 (*) 0.3 - 4.0 ng/mL    Troponin I <0.30  <0.30 ng/mL    Relative Index 1.5  0.0 - 2.5   MRSA PCR SCREENING     Status: Normal   Collection Time   03/16/12  3:07 PM      Component Value Range Comment   MRSA by PCR NEGATIVE  NEGATIVE   CARDIAC PANEL(CRET KIN+CKTOT+MB+TROPI)     Status: Abnormal   Collection Time   03/16/12  6:06 PM      Component Value Range Comment   Total CK 233 (*) 7 - 232 U/L    CK, MB 3.6  0.3 - 4.0 ng/mL    Troponin I <0.30  <0.30 ng/mL     Relative Index 1.5  0.0 - 2.5   HIV ANTIBODY (ROUTINE TESTING)     Status: Normal   Collection Time   03/16/12  6:06 PM      Component Value Range Comment   HIV NON REACTIVE  NON REACTIVE   CARDIAC PANEL(CRET KIN+CKTOT+MB+TROPI)     Status: Abnormal   Collection Time   03/17/12 12:55 AM      Component Value Range Comment   Total CK 258 (*) 7 - 232 U/L    CK, MB 3.2  0.3 - 4.0 ng/mL    Troponin I <0.30  <0.30 ng/mL    Relative Index 1.2  0.0 - 2.5   BASIC METABOLIC PANEL     Status: Abnormal   Collection Time   03/17/12  1:05 AM      Component Value Range Comment   Sodium 138  135 - 145 mEq/L    Potassium 3.3 (*) 3.5 - 5.1 mEq/L    Chloride 103  96 - 112 mEq/L    CO2 23  19 - 32 mEq/L    Glucose, Bld 111 (*) 70 - 99 mg/dL    BUN 17  6 - 23 mg/dL    Creatinine, Ser 1.61  0.50 - 1.35 mg/dL    Calcium 8.7  8.4 - 09.6 mg/dL    GFR calc non Af Amer 67 (*) >90 mL/min    GFR calc Af Amer 78 (*) >90 mL/min   CBC     Status: Normal   Collection Time   03/17/12  1:05 AM  Component Value Range Comment   WBC 6.5  4.0 - 10.5 K/uL    RBC 4.31  4.22 - 5.81 MIL/uL    Hemoglobin 13.5  13.0 - 17.0 g/dL    HCT 41.3  24.4 - 01.0 %    MCV 90.7  78.0 - 100.0 fL    MCH 31.3  26.0 - 34.0 pg    MCHC 34.5  30.0 - 36.0 g/dL    RDW 27.2  53.6 - 64.4 %    Platelets 212  150 - 400 K/uL   URINE RAPID DRUG SCREEN (HOSP PERFORMED)     Status: Abnormal   Collection Time   03/17/12  7:12 AM      Component Value Range Comment   Opiates NONE DETECTED  NONE DETECTED    Cocaine POSITIVE (*) NONE DETECTED    Benzodiazepines NONE DETECTED  NONE DETECTED    Amphetamines NONE DETECTED  NONE DETECTED    Tetrahydrocannabinol NONE DETECTED  NONE DETECTED    Barbiturates NONE DETECTED  NONE DETECTED     Dg Chest 2 View  03/16/2012  *RADIOLOGY REPORT*  Clinical Data: Left-sided chest pain.  Numbness in the left hand.  CHEST - 2 VIEW  Comparison: 03/11/2011  Findings: The heart size and pulmonary vascularity are  normal. The lungs appear clear and expanded without focal air space disease or consolidation. No blunting of the costophrenic angles.  No pneumothorax.  No significant change since previous study.  IMPRESSION: No evidence of active pulmonary disease.  Original Report Authenticated By: Marlon Pel, M.D.   Ct Chest W Contrast  03/16/2012  *RADIOLOGY REPORT*  Clinical Data: Chest pain and shortness of breath.  Cough.  CT CHEST WITH CONTRAST  Technique:  Multidetector CT imaging of the chest was performed following the standard protocol during bolus administration of intravenous contrast.  Contrast: 80mL OMNIPAQUE IOHEXOL 300 MG/ML  SOLN  Comparison: 03/16/2012  Findings: No enlarged axillary or supraclavicular lymph nodes.  No enlarged mediastinal or hilar lymph nodes.  No pericardial or pleural effusion identified.  The lungs appear clear.  No interstitial edema or airspace consolidation identified.  The lungs appear clear.  No airspace consolidation.  No pulmonary nodule or mass noted. Review of the visualized osseous structures is unremarkable.  No aggressive bone lesions identified.  Limited imaging through the upper abdomen is unremarkable.  No worrisome lytic or sclerotic bone lesions are identified.  IMPRESSION:  1.  No active cardiopulmonary abnormalities. 2.  No evidence for acute or chronic granulomatous disease within the chest.  Original Report Authenticated By: Rosealee Albee, M.D.    ROS Blood pressure 116/72, pulse 81, temperature 98.1 F (36.7 C), temperature source Oral, resp. rate 19, height 5\' 11"  (1.803 m), weight 95.5 kg (210 lb 8.6 oz), SpO2 99.00%. Physical Exam  alert on nbed  Mental Status Examination/Evaluation:  Appearance: on bed  Eye Contact:: Good  Speech: normal  Volume: Normal  Mood: depressed  Affect: ristricted  Thought Process: organized  Orientation: Full  Thought Content: NO AVH  Suicidal Thoughts: No  Homicidal Thoughts: no  Memory:  Recent; Poor  Judgement: Impaired  Insight: Lacking  Psychomotor Activity: Normal  Concentration: Fair  Recall: Fair  Akathisia: No  Assessment:  AXIS I: Alchol Dep, Cocaine Dep, adjustment d/o with dep mood. R/o substance induced mood d/o AXIS II: Deferred  AXIS III: see emdical hx ? ?  ? ?  ?  ? ?  ?  ? ?  ?  ? ?  ?  ?  AXIS IV: problems related to social environment, problems with access to health care services and problems with primary support group  AXIS V: 45  ?  Treatment Plan/Recommendations:  1.will recommend drug rehab after medical clearance. SW can help  2. Consider starting Prozac 10 mg QAM for depressive symtpoms  2. Will sign off now. plz call if needed. Thanks for consult   Wonda Cerise 03/17/2012, 4:12 PM

## 2012-03-18 DIAGNOSIS — F1410 Cocaine abuse, uncomplicated: Secondary | ICD-10-CM | POA: Diagnosis present

## 2012-03-18 LAB — QUANTIFERON TB GOLD ASSAY (BLOOD): Interferon Gamma Release Assay: NEGATIVE

## 2012-03-18 MED ORDER — FLUOXETINE HCL 10 MG PO CAPS: 10.0000 mg | ORAL_CAPSULE | Freq: Every day | ORAL | Status: DC

## 2012-03-18 NOTE — Progress Notes (Signed)
Utilization Review Completed.  

## 2012-03-18 NOTE — Progress Notes (Signed)
Pt wanted to ambulate out for his discharge. Accompanied by RN to short stay entrance. Pt able to see bus stop from the door and has his bus pass. Also has all his belongings. T Raniyah Curenton RN

## 2012-03-18 NOTE — Discharge Summary (Signed)
Physician Discharge Summary  Patient ID: Ian Wolf MRN: [id number] DOB/AGE: 53-06-1961 53 y.o.  Admit date: 03/16/2012 Discharge date: 03/18/2012  PCP: No primary provider on file.  DISCHARGE DIAGNOSES:  Principal Problem:  *Chest pain Active Problems:  Cough  History of TB (tuberculosis)  URTI (acute upper respiratory infection)  Cocaine abuse   DISCHARGE CONDITION: fair  INITIAL HISTORY: This is a 51 year old, African American male, with a past medical history of tuberculosis, which he says was diagnosed in 1994, for which he took medications for more than 6 months. And also has a history of depression, for which he is no longer taking any medications. He also has some chronic back and knee pain. He is homeless. On the day of admission he was sitting on a park bench when he started having pain in the chest. He describes it as a sharp pain without any radiation to any other site. It was 7/10 in intensity and would worsen with deep breathing. He was unable to tell if he had a fever or not but has been having a lot of sweating. Has been having some shortness of breath. He's had a cough for the last couple of weeks now with greenish expectoration, which he stated was getting better. He's had a few episodes of nausea, vomiting, a few days ago, but none currently. Had been a little dizzy, but denied any syncopal episodes. Denied any weight loss. The TB was diagnosed when he was in prison. Since then he has been incarcerated a few times.   HOSPITAL COURSE:   Pleuritic Chest pain  Patient likely has URTI/bronchitis. EKG was nonischemic. He has ruled out for ACS. No PE seen on CT. Pain is better. Will treat with antibiotics and inhalers. No further work up necessary at this time. Cocaine also contributed.  Cough with history of tuberculosis  He doesn't have any infiltrate on his chest x-ray or CT. Unlikely to be TB. Appreciate ID input. He likely has URTI. Treat with Z-pak and  Albuterol inhaler. No need for steroids.   Homeless  CSW has seen and provided patient with resources. He plans to go to [facility]. He will need assistance with medications as well. Case management consulted to assist with this.  Psychiatry Patient with history of PTSD and depression. Yesterday he was feeling quite low. Psychiatry was consulted. They recommended anti-depressants which was prescribed. No suicidal ideation.  Cocaine Abuse Patient strongly counseled to avoid cocaine. He was educated about its adverse effects on health. CSW to assist with drug rehab as outpatient.  Patient is medically stable. He is ready for discharge. SCW to see again for assistance with drug rehab. Stable for discharge.   PERTINENT LABS:  Urine drug screen was positive for cocaine. Troponins were normal.  IMAGING STUDIES Dg Chest 2 View  03/16/2012  *RADIOLOGY REPORT*  Clinical Data: Left-sided chest pain.  Numbness in the left hand.  CHEST - 2 VIEW  Comparison: 03/11/2011  Findings: The heart size and pulmonary vascularity are normal. The lungs appear clear and expanded without focal air space disease or consolidation. No blunting of the costophrenic angles.  No pneumothorax.  No significant change since previous study.  IMPRESSION: No evidence of active pulmonary disease.  Original Report Authenticated By: Marlon Pel, M.D.   Ct Chest W Contrast  03/16/2012  *RADIOLOGY REPORT*  Clinical Data: Chest pain and shortness of breath.  Cough.  CT CHEST WITH CONTRAST  Technique:  Multidetector CT imaging of the chest was  performed following the standard protocol during bolus administration of intravenous contrast.  Contrast: 80mL OMNIPAQUE IOHEXOL 300 MG/ML  SOLN  Comparison: 03/16/2012  Findings: No enlarged axillary or supraclavicular lymph nodes.  No enlarged mediastinal or hilar lymph nodes.  No pericardial or pleural effusion identified.  The lungs appear clear.  No interstitial edema or airspace  consolidation identified.  The lungs appear clear.  No airspace consolidation.  No pulmonary nodule or mass noted. Review of the visualized osseous structures is unremarkable.  No aggressive bone lesions identified.  Limited imaging through the upper abdomen is unremarkable.  No worrisome lytic or sclerotic bone lesions are identified.  IMPRESSION:  1.  No active cardiopulmonary abnormalities. 2.  No evidence for acute or chronic granulomatous disease within the chest.  Original Report Authenticated By: Rosealee Albee, M.D.    DISCHARGE EXAMINATION: Blood pressure 99/65, pulse 83, temperature 97.9 F (36.6 C), temperature source Oral, resp. rate 18, height 5\' 11"  (1.803 m), weight 95.1 kg (209 lb 10.5 oz), SpO2 98.00%. General appearance: alert, cooperative, appears stated age and no distress Resp: clear to auscultation bilaterally Cardio: regular rate and rhythm, S1, S2 normal, no murmur, click, rub or gallop GI: soft, non-tender; bowel sounds normal; no masses,  no organomegaly Extremities: extremities normal, atraumatic, no cyanosis or edema Neurologic: Grossly normal  DISPOSITION: Shelter  Discharge Orders    Future Orders Please Complete By Expires   Diet general      Diet - low sodium heart healthy      Increase activity slowly      Increase activity slowly        Current Discharge Medication List    START taking these medications   Details  albuterol (PROVENTIL HFA;VENTOLIN HFA) 108 (90 BASE) MCG/ACT inhaler Inhale 2 puffs into the lungs every 6 (six) hours as needed for wheezing. Qty: 1 Inhaler, Refills: 0    azithromycin (ZITHROMAX) 250 MG tablet 1 Tablet daily for 4 days Qty: 4 each, Refills: 4    FLUoxetine (PROZAC) 10 MG capsule Take 1 capsule (10 mg total) by mouth daily. Qty: 30 capsule, Refills: 0    ranitidine (ZANTAC) 150 MG capsule Take 1 capsule (150 mg total) by mouth 2 (two) times daily. Qty: 30 capsule, Refills: 0       Follow Up: Being arranged by CSW  and CM  TOTAL DISCHARGE TIME: 35 mins  [facility]  [facility] Pager [phone]  03/18/2012, 8:15 AM

## 2012-04-05 ENCOUNTER — Emergency Department (HOSPITAL_COMMUNITY)
Admission: EM | Admit: 2012-04-05 | Discharge: 2012-04-05 | Disposition: A | Discharge status: Home / Self Care | Payer: Medicaid Other | Attending: Emergency Medicine | Admitting: Emergency Medicine

## 2012-04-05 ENCOUNTER — Encounter (HOSPITAL_COMMUNITY): Payer: Self-pay | Admitting: Neurology

## 2012-04-05 ENCOUNTER — Emergency Department (HOSPITAL_COMMUNITY): Payer: Medicaid Other

## 2012-04-05 DIAGNOSIS — M542 Cervicalgia: Secondary | ICD-10-CM | POA: Insufficient documentation

## 2012-04-05 DIAGNOSIS — S8390XA Sprain of unspecified site of unspecified knee, initial encounter: Secondary | ICD-10-CM

## 2012-04-05 DIAGNOSIS — IMO0002 Reserved for concepts with insufficient information to code with codable children: Secondary | ICD-10-CM | POA: Insufficient documentation

## 2012-04-05 DIAGNOSIS — S161XXA Strain of muscle, fascia and tendon at neck level, initial encounter: Secondary | ICD-10-CM

## 2012-04-05 DIAGNOSIS — S139XXA Sprain of joints and ligaments of unspecified parts of neck, initial encounter: Secondary | ICD-10-CM | POA: Insufficient documentation

## 2012-04-05 DIAGNOSIS — Y92410 Unspecified street and highway as the place of occurrence of the external cause: Secondary | ICD-10-CM | POA: Insufficient documentation

## 2012-04-05 HISTORY — DX: Dorsalgia, unspecified: M54.9

## 2012-04-05 HISTORY — DX: Other chronic pain: G89.29

## 2012-04-05 MED ORDER — OXYCODONE-ACETAMINOPHEN 5-325 MG PO TABS
1.0000 | ORAL_TABLET | Freq: Once | ORAL | Status: AC
  Administered 2012-04-05: 1 via ORAL
  Filled 2012-04-05: qty 1

## 2012-04-05 MED ORDER — OXYCODONE-ACETAMINOPHEN 5-325 MG PO TABS: 1.0000 | ORAL_TABLET | Freq: Four times a day (QID) | ORAL | Status: AC | PRN

## 2012-04-05 NOTE — Progress Notes (Signed)
Orthopedic Tech Progress Note Patient Details:  Ian Wolf February 24, 1961 [id number]  Ortho Devices Type of Ortho Device: Crutches Ortho Device/Splint Interventions: Application   Nikki Dom 04/05/2012, 7:01 PM

## 2012-04-05 NOTE — ED Notes (Signed)
Paged ortho 
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
## 2012-04-05 NOTE — ED Notes (Signed)
Ortho coming down to to fit for crutches

## 2012-04-28 ENCOUNTER — Emergency Department (HOSPITAL_COMMUNITY): Payer: Medicaid Other

## 2012-04-28 ENCOUNTER — Emergency Department (HOSPITAL_COMMUNITY)
Admission: EM | Admit: 2012-04-28 | Discharge: 2012-04-28 | Disposition: A | Discharge status: Home / Self Care | Payer: Medicaid Other | Attending: Emergency Medicine | Admitting: Emergency Medicine

## 2012-04-28 DIAGNOSIS — R10812 Left upper quadrant abdominal tenderness: Secondary | ICD-10-CM | POA: Insufficient documentation

## 2012-04-28 DIAGNOSIS — Z8611 Personal history of tuberculosis: Secondary | ICD-10-CM | POA: Insufficient documentation

## 2012-04-28 DIAGNOSIS — R0602 Shortness of breath: Secondary | ICD-10-CM | POA: Insufficient documentation

## 2012-04-28 DIAGNOSIS — R109 Unspecified abdominal pain: Secondary | ICD-10-CM | POA: Insufficient documentation

## 2012-04-28 DIAGNOSIS — R079 Chest pain, unspecified: Secondary | ICD-10-CM | POA: Insufficient documentation

## 2012-04-28 DIAGNOSIS — M549 Dorsalgia, unspecified: Secondary | ICD-10-CM | POA: Insufficient documentation

## 2012-04-28 LAB — CBC WITH DIFFERENTIAL/PLATELET
Eosinophils Absolute: 0.4 10*3/uL (ref 0.0–0.7)
HCT: 37.8 % — ABNORMAL LOW (ref 39.0–52.0)
Hemoglobin: 13.3 g/dL (ref 13.0–17.0)
Lymphs Abs: 1.7 10*3/uL (ref 0.7–4.0)
MCH: 31.7 pg (ref 26.0–34.0)
Monocytes Absolute: 0.4 10*3/uL (ref 0.1–1.0)
Monocytes Relative: 6 % (ref 3–12)
Neutrophils Relative %: 61 % (ref 43–77)
RBC: 4.19 MIL/uL — ABNORMAL LOW (ref 4.22–5.81)

## 2012-04-28 LAB — BASIC METABOLIC PANEL
BUN: 13 mg/dL (ref 6–23)
Chloride: 104 mEq/L (ref 96–112)
Creatinine, Ser: 1.16 mg/dL (ref 0.50–1.35)
GFR calc non Af Amer: 71 mL/min — ABNORMAL LOW (ref >90)
Glucose, Bld: 90 mg/dL (ref 70–99)
Potassium: 4.1 mEq/L (ref 3.5–5.1)

## 2012-04-28 LAB — POCT I-STAT TROPONIN I: Troponin i, poc: 0.01 ng/mL (ref 0.00–0.08)

## 2012-04-28 MED ORDER — ASPIRIN 81 MG PO CHEW: 324.0000 mg | CHEWABLE_TABLET | Freq: Once | ORAL | Status: DC
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
## 2012-04-28 NOTE — ED Notes (Signed)
Pt d/c home in NAD. Pt voiced understanding of d/c instructions and need for follow up care.
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
## 2012-04-28 NOTE — ED Notes (Addendum)
Pt states "my stomach hurts too when it gets poked." Pain is all over stomach when palpated, worse in LUQ. Stomach is soft to palpation, no mass palpated. Pt states his LUQ "hurts when I breathe."

## 2012-04-30 NOTE — ED Provider Notes (Signed)
Medical screening examination/treatment/procedure(s) were conducted as a shared visit with non-physician practitioner(s) and myself.  I personally evaluated the patient during the encounter  Advised two sets of cardiac markers and reassessment of patient Pt stable in the ED  BP 120/65  Pulse 77  Temp 97.8 F (36.6 C) (Oral)  Resp 21  SpO2 100%   Joya Gaskins, MD 04/30/12 1836

## 2012-05-10 ENCOUNTER — Emergency Department (HOSPITAL_BASED_OUTPATIENT_CLINIC_OR_DEPARTMENT_OTHER)
Admission: EM | Admit: 2012-05-10 | Discharge: 2012-05-10 | Disposition: A | Discharge status: Home / Self Care | Payer: Medicaid Other | Attending: Emergency Medicine | Admitting: Emergency Medicine

## 2012-05-10 ENCOUNTER — Encounter (HOSPITAL_BASED_OUTPATIENT_CLINIC_OR_DEPARTMENT_OTHER): Payer: Self-pay | Admitting: *Deleted

## 2012-05-10 DIAGNOSIS — F4310 Post-traumatic stress disorder, unspecified: Secondary | ICD-10-CM | POA: Insufficient documentation

## 2012-05-10 DIAGNOSIS — G8929 Other chronic pain: Secondary | ICD-10-CM | POA: Insufficient documentation

## 2012-05-10 DIAGNOSIS — L259 Unspecified contact dermatitis, unspecified cause: Secondary | ICD-10-CM | POA: Insufficient documentation

## 2012-05-10 HISTORY — DX: Cocaine dependence, uncomplicated: F14.20

## 2012-05-10 MED ORDER — DEXAMETHASONE SODIUM PHOSPHATE 10 MG/ML IJ SOLN
10.0000 mg | Freq: Once | INTRAMUSCULAR | Status: AC
  Administered 2012-05-10: 10 mg via INTRAMUSCULAR
  Filled 2012-05-10: qty 1

## 2012-05-10 NOTE — Discharge Instructions (Signed)
Please read and follow all provided instructions.  Your diagnoses today include:  1. Contact dermatitis     Tests performed today include:  Vital signs. See below for your results today.   Medications prescribed:   None  Home care instructions:  Follow any educational materials contained in this packet.  You may use topical hydrocortisone cream for itching as well.  Follow-up instructions: Please follow-up with your primary care provider as needed for further evaluation of your symptoms.  If you do not have a primary care doctor -- see below for referral information.   Return instructions:   Please return to the Emergency Department if you experience worsening symptoms.   Please return if you have any other emergent concerns.  Additional Information:  Your vital signs today were: BP 104/62   Pulse 71   Temp 98.2 F (36.8 C) (Oral)   Resp 20   SpO2 98% If your blood pressure (BP) was elevated above 135/85 this visit, please have this repeated by your doctor within one month. -------------- RESOURCE GUIDE  Chronic Pain Problems:  [facility]:  [phone]  Patients need to be referred by their primary care doctor  Insufficient Money for Medicine:  [employer]:     call "211"  [facility]:  [phone]  No Primary Care Doctor:  To locate a primary care doctor that accepts your insurance or provides certain services:  [facility] :   [phone]  Physician Referral Service:  [phone]  Agencies that provide inexpensive medical care:  [facility]:   [phone]  [facility]:   [phone]  [facility]:   [phone]  Women's Clinic:     [phone]  Planned Parenthood:    [phone]  [facility]:     [phone]  Medicaid-accepting [facility] Providers:  [facility]  [address], [phone], Mon-Fri 9am-7pm, Sat 9am-1pm  [facility]  [address] [place], [address] [place], [phone]  [facility]  [address], [address] [place], [phone]  [facility]  [address], [phone]  Renaye Rakers  [address], [phone]  Only accepts [place] [employer] patients after they have their name  applied to their card  Self Pay (no insurance) in [facility]:  Sickle Cell Patients: Dr. Willey Blade, [facility]  [address] [place], [phone]  [facility]  [address] [place], [phone]  [facility]  [facility] [facility] [address], [address], [facility] - [address]  [phone], Mon-Fri 9am-7pm, Sat 9am-1pm  [facility]  [address] [place], [phone]  [facility]  [address], [phone]  [facility]  [address], [phone]  Dr. Julio Sicks  [address], [place], [phone]  [facility]  [address], [phone]  [facility]  [address] [phone]  [address], [phone]  [facility]  [address] [place], [phone], 1st & 3rd Saturday every month, 10am-1pm  Strategies for finding a Primary Care Provider:  1) Find a Doctor and Pay Out of Pocket Although you won't have to find out who is covered by your insurance plan, it is a good idea to ask around and get recommendations. You will  then need to call the office and see if the doctor you have chosen will accept you as a new patient and what types of options they offer for patients who are self-pay. Some doctors offer discounts or will set up payment plans for their patients who do not have insurance, but you will need to ask so you aren't surprised when you get to your appointment.  2) Contact Your Local Health Department Not all health departments have doctors that can see  patients for sick visits, but many do, so it is worth a call to see if yours does. If you don't know where your local health department is, you can check in your phone book. The CDC also has a tool to help you locate your state's health department, and many state websites also have listings of all of their local health departments.  3) Find a Walk-in Clinic If your illness is not likely to be very severe or complicated, you may want to try a walk in clinic. These are popping up all over the country in pharmacies, drugstores, and shopping centers. They're usually staffed by nurse practitioners or physician assistants that have been trained to treat common illnesses and complaints. They're usually fairly quick and inexpensive. However, if you have serious medical issues or chronic medical problems, these are probably not your best option  STD Testing:  [facility] [place] [facility]  [address], [place], phone [phone] or [phone]    Monday - Friday, call for an appointment  [facility] of [facility], [place] [facility]  [address], [place], phone [phone] or [phone]   Monday - Friday, call for an appointment  Abuse/Neglect:  [facility] Child Abuse Hotline:  315-804-7334  [facility] Child Abuse Hotline: 770-443-4426 (After Hours)  Emergency Shelter:  [facility] [phone]  Maternity Homes:  [facility] at the [place] [facility]: [phone]  [facility]: [phone]  MRSA Hotline:   601 097 1662   [facility]  [address]  [phone]   [place]  [address] [place], [place]  [phone]   [facility]  [address], [place]  [phone]   [facility] Mental Health  [phone]   [facility]  Services  [phone]   [facility] in [place]  [address]  [phone], [facility] Child Abuse Hotline  346-529-5197  629-212-4636 (After Hours)   Behavioral Health Services  Substance Abuse Resources:  Alcohol and Drug Services:     819-101-3443  [facility]:   [phone]  [facility]:     [phone]  [facility]:      [phone]  Residential & Outpatient Substance Abuse Program: [phone]  Psychological Services:  [facility]:   [phone]  [facility] Services:    [phone]  [facility] Mental Health  [address], [place]  ACCESS LINE: (603)028-8996 or [phone]  [employer]  Dental Assistance: If unable to pay or uninsured, contact:  [facility] or [facility]. to become qualified for the adult dental clinic.  Patients with Medicaid  [facility]  [phone] [address], [phone]  [address] [phone]  If unable to pay, or uninsured: contact [facility] 803-634-0013) or [facility] Department [phone] in [place], [phone] in [place]) to become qualified for the adult dental clinic  Other Proofreader Services:  [facility]  [address] [place], [place], [place], [address]  [phone], Ext. [phone]  2nd and 4th Thursday of the month at [facility]  [facility]  [address] [place], [place], [place], [address]  [phone]  [facility]  [address] [place], [place], [address]  [phone]  [facility] Health Department  [phone]  [facility] Health Department  [phone]  [facility] Health Department  [phone]

## 2012-05-10 NOTE — ED Notes (Signed)
Rash for a week. Thinks he has poison ivy. He is a resident at [facility] drug [facility] rehab at the present time.

## 2012-05-10 NOTE — ED Provider Notes (Signed)
History     CSN: [id number]  Arrival date & time 05/10/12  1354   First MD Initiated Contact with Patient 05/10/12 1411      Chief Complaint  Patient presents with  . Rash    (Consider location/radiation/quality/duration/timing/severity/associated sxs/prior treatment) HPI Comments: Patient presents with complaint of bilateral forearm itching for the past 7 days. Patient was working outside and thinks he was exposed to poison oak. He has tried hydrocortisone cream however this is just made his arms itch more. No involvement of face or eyes.  No other new exposures. No fevers, chills, neck pain. No drainage from the areas. Nothing makes the symptoms better. Onset was gradual. Course is constant.  The history is provided by the patient.    Past Medical History  Diagnosis Date  . Post traumatic stress disorder (PTSD)   . Tuberculosis 1995  . Chronic back pain   . Cocaine addiction     Past Surgical History  Procedure Date  . Knee surgery     L    No family history on file.  History  Substance Use Topics  . Smoking status: Never Smoker   . Smokeless tobacco: Not on file  . Alcohol Use: 0.6 oz/week    1 Cans of beer per week      Review of Systems  Constitutional: Negative for fever.  HENT: Negative for neck pain and neck stiffness.   Eyes: Negative for photophobia, discharge, redness and itching.  Respiratory: Negative for cough.   Gastrointestinal: Negative for nausea and vomiting.  Musculoskeletal: Negative for myalgias.  Skin: Positive for rash.    Allergies  Review of patient's allergies indicates no known allergies.  Home Medications   Current Outpatient Rx  Name Route Sig Dispense Refill  . ALBUTEROL SULFATE HFA 108 (90 BASE) MCG/ACT IN AERS Inhalation Inhale 2 puffs into the lungs every 6 (six) hours as needed. For shortness of breath    . FLUOXETINE HCL 10 MG PO CAPS Oral Take 1 capsule (10 mg total) by mouth daily. 30 capsule 0  . RANITIDINE HCL  150 MG PO CAPS Oral Take 1 capsule (150 mg total) by mouth 2 (two) times daily. 30 capsule 0    BP 104/62  Pulse 71  Temp 98.2 F (36.8 C) (Oral)  Resp 20  SpO2 98%  Physical Exam  Nursing note and vitals reviewed. Constitutional: He appears well-developed and well-nourished.  HENT:  Head: Normocephalic and atraumatic.  Eyes: Conjunctivae normal are normal.  Neck: Normal range of motion. Neck supple.  Pulmonary/Chest: No respiratory distress.  Neurological: He is alert.  Skin: Skin is warm and dry.       Multiple small linear vesicles consistent with contact dermatitis on bilateral forearms.  Psychiatric: He has a normal mood and affect.    ED Course  Procedures (including critical care time)  Labs Reviewed - No data to display No results found.   1. Contact dermatitis     2:16 PM Patient seen and examined. Will give IM decadron for symptomatic relief. Counseled patient to use OTC cortisone cream as well.    Vital signs reviewed and are as follows: Filed Vitals:   05/10/12 1406  BP: 104/62  Pulse: 71  Temp: 98.2 F (36.8 C)  Resp: 20   No DM, h/o GI bleeding, renal problems.    MDM  Patient with exam and history consistent with contact dermatitis.       Renne Crigler, [place] 05/10/12 1455

## 2012-05-10 NOTE — ED Notes (Signed)
Josh, PA-C at bedside 

## 2012-05-13 NOTE — ED Provider Notes (Signed)
Medical screening examination/treatment/procedure(s) were performed by non-physician practitioner and as supervising physician I was immediately available for consultation/collaboration.  Chalsey Leeth, MD 05/13/12 1057 

## 2013-04-12 IMAGING — CR DG CHEST 1V PORT
1 series · 1 of 1 positions shown · non-contrast
Comparison: Radiographs and CT 03/16/2012.

CLINICAL DATA: Left chest pain.  History of bronchitis.

PORTABLE CHEST - 1 VIEW

[AP]
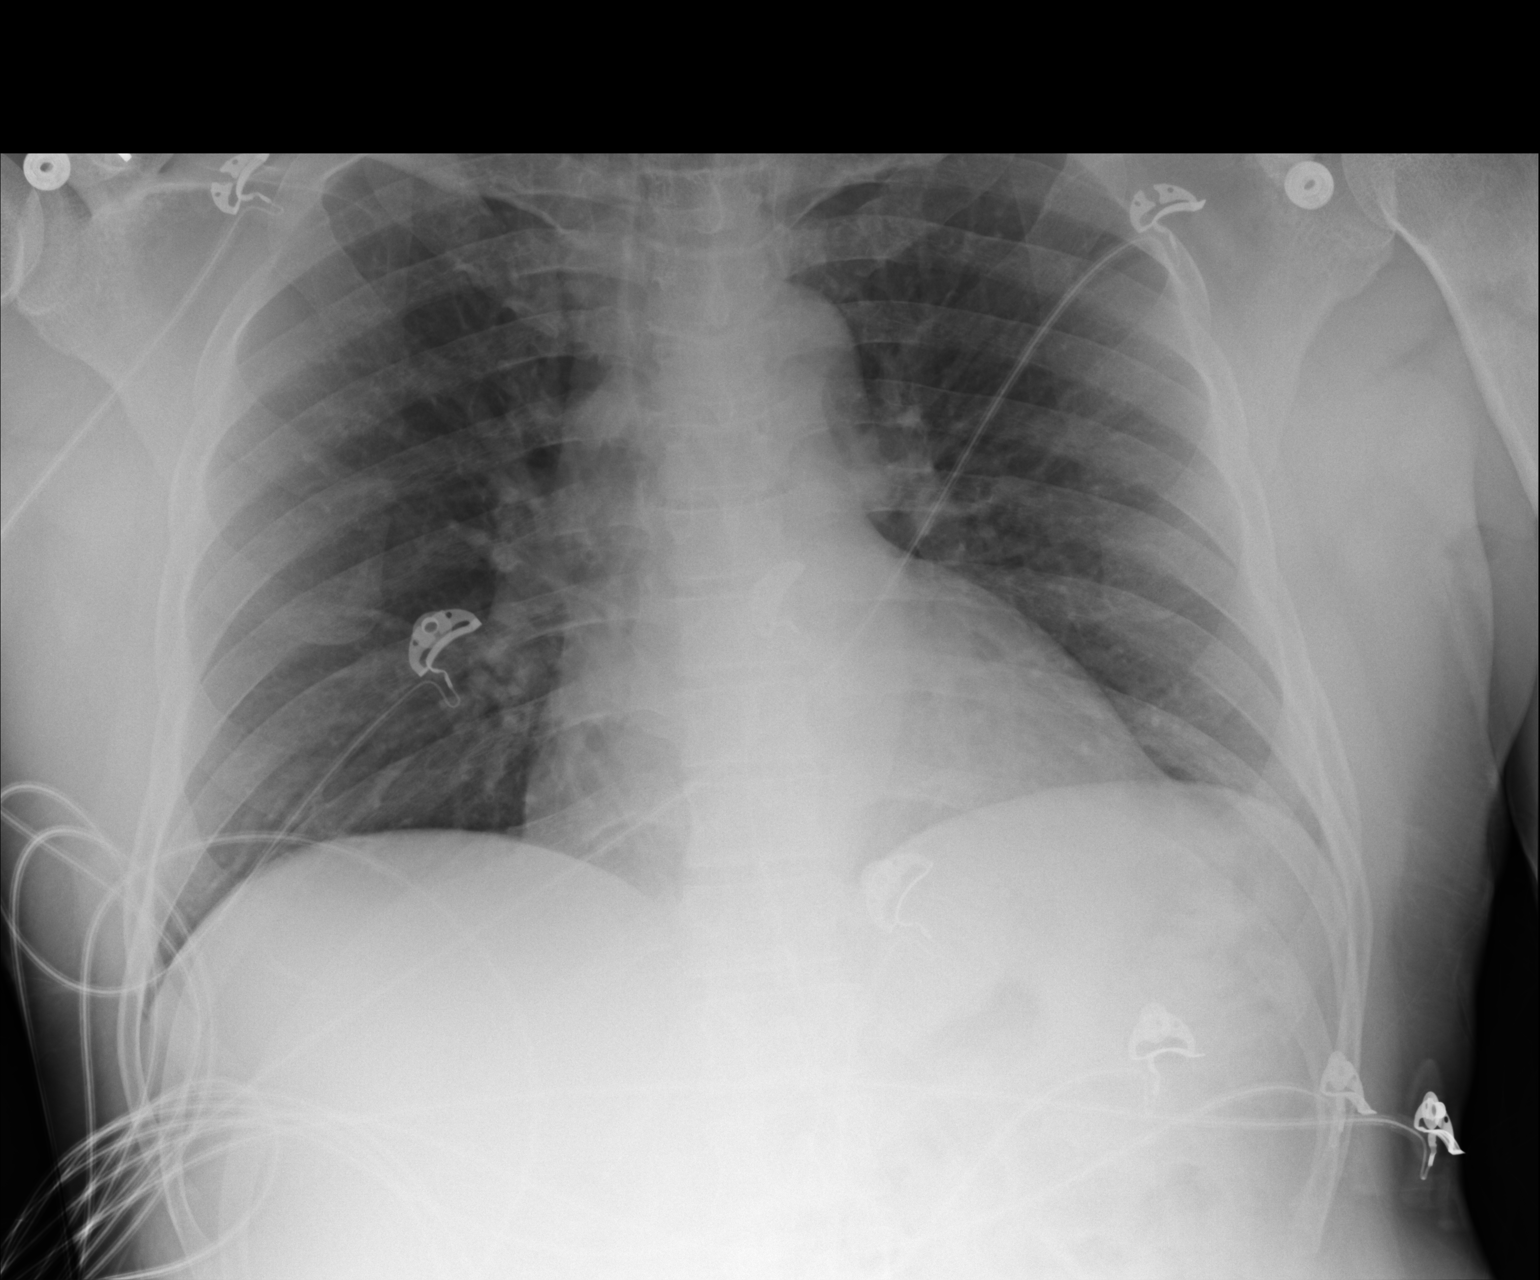

[1 of 1 positions shown; findings below may reference images not displayed]

FINDINGS: 1656 hours.  There are lower lung volumes.  Heart size
and mediastinal contours are stable without evidence of mediastinal
hematoma.  The lungs are clear.  There is no pleural effusion or
pneumothorax.  Telemetry leads overlie the chest.
IMPRESSION: Lower lung volumes.  Otherwise stable examination without evidence
of acute cardiopulmonary process.

## 2013-07-05 ENCOUNTER — Encounter (HOSPITAL_COMMUNITY): Payer: Self-pay | Admitting: Emergency Medicine

## 2013-07-05 ENCOUNTER — Emergency Department (HOSPITAL_COMMUNITY): Payer: Medicaid Other

## 2013-07-05 ENCOUNTER — Emergency Department (HOSPITAL_COMMUNITY)
Admission: EM | Admit: 2013-07-05 | Discharge: 2013-07-05 | Disposition: A | Discharge status: Home / Self Care | Payer: Medicaid Other | Attending: Emergency Medicine | Admitting: Emergency Medicine

## 2013-07-05 DIAGNOSIS — R071 Chest pain on breathing: Secondary | ICD-10-CM | POA: Insufficient documentation

## 2013-07-05 DIAGNOSIS — F4310 Post-traumatic stress disorder, unspecified: Secondary | ICD-10-CM | POA: Insufficient documentation

## 2013-07-05 DIAGNOSIS — G8929 Other chronic pain: Secondary | ICD-10-CM | POA: Insufficient documentation

## 2013-07-05 DIAGNOSIS — R0789 Other chest pain: Secondary | ICD-10-CM

## 2013-07-05 DIAGNOSIS — R0602 Shortness of breath: Secondary | ICD-10-CM | POA: Insufficient documentation

## 2013-07-05 DIAGNOSIS — Z79899 Other long term (current) drug therapy: Secondary | ICD-10-CM | POA: Insufficient documentation

## 2013-07-05 DIAGNOSIS — IMO0001 Reserved for inherently not codable concepts without codable children: Secondary | ICD-10-CM | POA: Insufficient documentation

## 2013-07-05 DIAGNOSIS — Z8619 Personal history of other infectious and parasitic diseases: Secondary | ICD-10-CM | POA: Insufficient documentation

## 2013-07-05 LAB — CBC WITH DIFFERENTIAL/PLATELET
Basophils Absolute: 0 10*3/uL (ref 0.0–0.1)
Eosinophils Absolute: 0.1 10*3/uL (ref 0.0–0.7)
Hemoglobin: 13.5 g/dL (ref 13.0–17.0)
Lymphocytes Relative: 29 % (ref 12–46)
Lymphs Abs: 2 10*3/uL (ref 0.7–4.0)
MCH: 31.5 pg (ref 26.0–34.0)
Monocytes Relative: 10 % (ref 3–12)
Neutrophils Relative %: 58 % (ref 43–77)
Platelets: 225 10*3/uL (ref 150–400)
RBC: 4.28 MIL/uL (ref 4.22–5.81)
WBC: 6.7 10*3/uL (ref 4.0–10.5)

## 2013-07-05 LAB — COMPREHENSIVE METABOLIC PANEL
ALT: 21 U/L (ref 0–53)
AST: 23 U/L (ref 0–37)
Alkaline Phosphatase: 87 U/L (ref 39–117)
Calcium: 8.9 mg/dL (ref 8.4–10.5)
Chloride: 103 mEq/L (ref 96–112)
GFR calc Af Amer: 88 mL/min — ABNORMAL LOW (ref >90)
Glucose, Bld: 87 mg/dL (ref 70–99)
Potassium: 3.6 mEq/L (ref 3.5–5.1)
Sodium: 140 mEq/L (ref 135–145)
Total Protein: 7 g/dL (ref 6.0–8.3)

## 2013-07-05 MED ORDER — KETOROLAC TROMETHAMINE 60 MG/2ML IM SOLN
60.0000 mg | Freq: Once | INTRAMUSCULAR | Status: AC
  Administered 2013-07-05: 60 mg via INTRAMUSCULAR
  Filled 2013-07-05: qty 2

## 2013-07-05 MED ORDER — IBUPROFEN 600 MG PO TABS: 600.0000 mg | ORAL_TABLET | Freq: Four times a day (QID) | ORAL | Status: DC | PRN

## 2013-07-05 NOTE — ED Notes (Addendum)
Per EMS, Patient began having non-radiating sub-sternal CP today. Denies n/v and diaphoresis. Pain is not producible with deep inspiration or palpation. EMS gave 324 ASA and nitro x2. BP 113/70 HR 90 96% on RA. Patients rates pain 10/10.

## 2013-07-05 NOTE — ED Provider Notes (Signed)
CSN: [id number]     Arrival date & time 07/05/13  0240 History   First MD Initiated Contact with Patient 07/05/13 0242     Chief Complaint  Patient presents with  . Chest Pain   (Consider location/radiation/quality/duration/timing/severity/associated sxs/prior Treatment) HPI Patient states he got into an argument with his daughter this evening and shortly after developed left-sided chest pain. The pain does not radiate. Is associated with mild shortness of breath. Patient states the pain is worse with deep inspiration. Patient denies any recent cough or fever. Denies any chest trauma. She has no associated nausea or diaphoresis. Patient denies any lower sugary swelling or pain. Denies any cardiac history or family history of coronary artery disease. Patient admits to smoking occasionally. He does have a history of cocaine use in the past. Patient was given nitroglycerin and aspirin in route by EMS. Past Medical History  Diagnosis Date  . Post traumatic stress disorder (PTSD)   . Tuberculosis 1995  . Chronic back pain   . Cocaine addiction    Past Surgical History  Procedure Laterality Date  . Knee surgery      L   No family history on file. History  Substance Use Topics  . Smoking status: Never Smoker   . Smokeless tobacco: Not on file  . Alcohol Use: 0.6 oz/week    1 Cans of beer per week    Review of Systems  Constitutional: Negative for fever and chills.  Respiratory: Positive for shortness of breath.   Cardiovascular: Positive for chest pain. Negative for palpitations and leg swelling.  Gastrointestinal: Negative for nausea, vomiting, abdominal pain and diarrhea.  Musculoskeletal: Positive for myalgias. Negative for back pain, neck pain and neck stiffness.  Skin: Negative for rash and wound.  Neurological: Negative for dizziness, weakness, light-headedness, numbness and headaches.  All other systems reviewed and are negative.    Allergies  Review of patient's  allergies indicates no known allergies.  Home Medications   Current Outpatient Rx  Name  Route  Sig  Dispense  Refill  . gabapentin (NEURONTIN) 300 MG capsule   Oral   Take 300 mg by mouth 3 (three) times daily.         Marland Kitchen ibuprofen (ADVIL,MOTRIN) 200 MG tablet   Oral   Take 200 mg by mouth every 6 (six) hours as needed.         . sertraline (ZOLOFT) 50 MG tablet   Oral   Take 50 mg by mouth daily.          BP 96/61  Pulse 82  Temp(Src) 98.4 F (36.9 C) (Oral)  Resp 18  SpO2 96% Physical Exam  Nursing note and vitals reviewed. Constitutional: He is oriented to person, place, and time. He appears well-developed and well-nourished. No distress.  HENT:  Head: Normocephalic and atraumatic.  Mouth/Throat: Oropharynx is clear and moist.  Eyes: EOM are normal. Pupils are equal, round, and reactive to light.  Neck: Normal range of motion. Neck supple.  Cardiovascular: Normal rate and regular rhythm.   Pulmonary/Chest: Effort normal and breath sounds normal. No respiratory distress. He has no wheezes. He has no rales. He exhibits tenderness (patient's chest pain is completely reproduced with palpation over his left pectoralis muscle. He has no crepitance or deformity of the chest wall.).  Abdominal: Soft. Bowel sounds are normal. He exhibits no distension and no mass. There is no tenderness. There is no rebound and no guarding.  Musculoskeletal: Normal range of motion. He exhibits no  edema and no tenderness.  No calf swelling or tenderness.  Neurological: He is alert and oriented to person, place, and time.  Patient is alert and oriented x3 with clear, goal oriented speech. Patient has 5/5 motor in all extremities. Sensation is intact to light touch.    Skin: Skin is warm and dry. No rash noted. No erythema.  Psychiatric: He has a normal mood and affect. His behavior is normal.    ED Course  Procedures (including critical care time) Labs Review Labs Reviewed  CBC WITH  DIFFERENTIAL  COMPREHENSIVE METABOLIC PANEL  TROPONIN I  D-DIMER, QUANTITATIVE   Imaging Review No results found.  EKG Interpretation    Date/Time:  Saturday July 05 2013 02:50:11 EST Ventricular Rate:  77 PR Interval:  167 QRS Duration: 77 QT Interval:  387 QTC Calculation: 438 R Axis:   54 Text Interpretation:  Age not entered, assumed to be  52 years old for purpose of ECG interpretation Sinus rhythm Confirmed by Ranae Palms  MD, Izekiel Flegel ([id number]) on 07/05/2013 3:47:20 AM            MDM  Patient's exam is consistent with chest wall pain likely musculoskeletal in origin. He has a negative d-dimer and a normal EKG. Troponin is negative. We'll discharge home with NSAIDs. Return precautions have been given.    Loren Racer, MD 07/05/13 [phone]

## 2013-08-16 ENCOUNTER — Encounter (HOSPITAL_COMMUNITY): Payer: Self-pay | Admitting: Emergency Medicine

## 2013-08-16 ENCOUNTER — Emergency Department (HOSPITAL_COMMUNITY): Payer: Medicaid Other

## 2013-08-16 ENCOUNTER — Emergency Department (HOSPITAL_COMMUNITY)
Admission: EM | Admit: 2013-08-16 | Discharge: 2013-08-16 | Disposition: A | Discharge status: Home / Self Care | Payer: Medicaid Other | Attending: Emergency Medicine | Admitting: Emergency Medicine

## 2013-08-16 DIAGNOSIS — R071 Chest pain on breathing: Secondary | ICD-10-CM | POA: Insufficient documentation

## 2013-08-16 DIAGNOSIS — M549 Dorsalgia, unspecified: Secondary | ICD-10-CM | POA: Insufficient documentation

## 2013-08-16 DIAGNOSIS — F1420 Cocaine dependence, uncomplicated: Secondary | ICD-10-CM | POA: Insufficient documentation

## 2013-08-16 DIAGNOSIS — R1010 Upper abdominal pain, unspecified: Secondary | ICD-10-CM

## 2013-08-16 DIAGNOSIS — F1010 Alcohol abuse, uncomplicated: Secondary | ICD-10-CM | POA: Insufficient documentation

## 2013-08-16 DIAGNOSIS — R0789 Other chest pain: Secondary | ICD-10-CM

## 2013-08-16 DIAGNOSIS — Z8611 Personal history of tuberculosis: Secondary | ICD-10-CM | POA: Insufficient documentation

## 2013-08-16 DIAGNOSIS — R404 Transient alteration of awareness: Secondary | ICD-10-CM | POA: Insufficient documentation

## 2013-08-16 DIAGNOSIS — F4310 Post-traumatic stress disorder, unspecified: Secondary | ICD-10-CM | POA: Insufficient documentation

## 2013-08-16 DIAGNOSIS — G8929 Other chronic pain: Secondary | ICD-10-CM | POA: Insufficient documentation

## 2013-08-16 DIAGNOSIS — R109 Unspecified abdominal pain: Secondary | ICD-10-CM | POA: Insufficient documentation

## 2013-08-16 DIAGNOSIS — R110 Nausea: Secondary | ICD-10-CM | POA: Insufficient documentation

## 2013-08-16 LAB — POCT I-STAT TROPONIN I
Troponin i, poc: 0 ng/mL (ref 0.00–0.08)
Troponin i, poc: 0.01 ng/mL (ref 0.00–0.08)

## 2013-08-16 LAB — CBC
HCT: 40.8 % (ref 39.0–52.0)
Hemoglobin: 14.1 g/dL (ref 13.0–17.0)
MCH: 31.3 pg (ref 26.0–34.0)
MCHC: 34.6 g/dL (ref 30.0–36.0)
MCV: 90.5 fL (ref 78.0–100.0)
Platelets: 238 10*3/uL (ref 150–400)
RBC: 4.51 MIL/uL (ref 4.22–5.81)
RDW: 14.3 % (ref 11.5–15.5)
WBC: 6.1 10*3/uL (ref 4.0–10.5)

## 2013-08-16 LAB — RAPID URINE DRUG SCREEN, HOSP PERFORMED
Amphetamines: NOT DETECTED
Barbiturates: NOT DETECTED
Benzodiazepines: NOT DETECTED
Cocaine: POSITIVE — AB
OPIATES: NOT DETECTED
Tetrahydrocannabinol: NOT DETECTED

## 2013-08-16 LAB — HEPATIC FUNCTION PANEL
ALBUMIN: 3.9 g/dL (ref 3.5–5.2)
ALT: 20 U/L (ref 0–53)
AST: 21 U/L (ref 0–37)
Alkaline Phosphatase: 100 U/L (ref 39–117)
Bilirubin, Direct: <0.2 mg/dL (ref 0.0–0.3)
TOTAL PROTEIN: 7.3 g/dL (ref 6.0–8.3)
Total Bilirubin: 0.2 mg/dL — ABNORMAL LOW (ref 0.3–1.2)

## 2013-08-16 LAB — BASIC METABOLIC PANEL
BUN: 11 mg/dL (ref 6–23)
CO2: 23 mEq/L (ref 19–32)
Calcium: 9 mg/dL (ref 8.4–10.5)
Chloride: 104 mEq/L (ref 96–112)
Creatinine, Ser: 1.09 mg/dL (ref 0.50–1.35)
GFR calc Af Amer: 87 mL/min — ABNORMAL LOW (ref >90)
GFR, EST NON AFRICAN AMERICAN: 75 mL/min — AB (ref >90)
Glucose, Bld: 98 mg/dL (ref 70–99)
POTASSIUM: 4.3 meq/L (ref 3.7–5.3)
SODIUM: 141 meq/L (ref 137–147)

## 2013-08-16 LAB — LIPASE, BLOOD: Lipase: 28 U/L (ref 11–59)

## 2013-08-16 LAB — PRO B NATRIURETIC PEPTIDE: PRO B NATRI PEPTIDE: 5.1 pg/mL (ref 0–125)

## 2013-08-16 LAB — ETHANOL: Alcohol, Ethyl (B): 50 mg/dL — ABNORMAL HIGH (ref 0–11)

## 2013-08-16 MED ORDER — SUCRALFATE 1 G PO TABS: 1.0000 g | ORAL_TABLET | Freq: Four times a day (QID) | ORAL | Status: DC

## 2013-08-16 MED ORDER — GI COCKTAIL ~~LOC~~
30.0000 mL | Freq: Once | ORAL | Status: AC
  Administered 2013-08-16: 30 mL via ORAL
  Filled 2013-08-16: qty 30

## 2013-08-16 MED ORDER — FAMOTIDINE 20 MG PO TABS: 20.0000 mg | ORAL_TABLET | Freq: Two times a day (BID) | ORAL | Status: DC

## 2013-08-16 NOTE — ED Notes (Signed)
Pt arrives via EMS after experiencing chest discomfort 45 mins ago while ambulating. Pain increased when abd is palpated. Pt states no BM since Thursday night/Friday morning. 12 lead unremarkable.

## 2013-08-16 NOTE — ED Notes (Signed)
Dr Juleen ChinaKohut in to see pt.  Pt is good for d/c.  Pt still not happy about being d/c.  Pt given bus pass.  Pt ambulatory and in NAD upon d/c.  Pt refused to sign for d/c papers.  Pt did receive instructions, prescriptions, and paperwork.

## 2013-08-16 NOTE — ED Provider Notes (Signed)
CSN: [id number]     Arrival date & time 08/16/13  0440 History   First MD Initiated Contact with Patient 08/16/13 [phone]     Chief Complaint  Patient presents with  . Chest Pain   (Consider location/radiation/quality/duration/timing/severity/associated sxs/prior Treatment) HPI 53 year old male presents to emergency room via EMS with complaint of left lower chest pain.  Symptoms started about an hour ago, as he was walking from a party to get home.  Patient reports he has a fullness sensation in his left upper abdomen.  He has had nausea associated with the symptoms.  He denies any diaphoresis or shortness of breath.  He had similar symptoms a few months ago, and came to the emergency department.  He denies history of diabetes, hypertension, hyperlipidemia.  No previous history of coronary disease.  He has history of back pain, knee, pain.  Chart review shows history of prior cocaine use.  Patient denies smoking, denies any alcohol or cocaine use tonight.  Patient is extremely somnolent during my evaluation falling asleep frequently Past Medical History  Diagnosis Date  . Post traumatic stress disorder (PTSD)   . Tuberculosis 1995  . Chronic back pain   . Cocaine addiction    Past Surgical History  Procedure Laterality Date  . Knee surgery      L   History reviewed. No pertinent family history. History  Substance Use Topics  . Smoking status: Never Smoker   . Smokeless tobacco: Not on file  . Alcohol Use: 0.6 oz/week    1 Cans of beer per week    Review of Systems  See History of Present Illness; otherwise all other systems are reviewed and negative Allergies  Review of patient's allergies indicates no known allergies.  Home Medications   Current Outpatient Rx  Name  Route  Sig  Dispense  Refill  . ibuprofen (ADVIL,MOTRIN) 200 MG tablet   Oral   Take 200 mg by mouth every 6 (six) hours as needed.          BP 121/75  Pulse 90  Temp(Src) 98 F (36.7 C) (Oral)  Resp 12   SpO2 96% Physical Exam  Nursing note and vitals reviewed. Constitutional: He is oriented to person, place, and time. He appears well-developed and well-nourished.  HENT:  Head: Normocephalic and atraumatic.  Nose: Nose normal.  Mouth/Throat: Oropharynx is clear and moist.  Eyes: Conjunctivae and EOM are normal. Pupils are equal, round, and reactive to light.  Neck: Normal range of motion. Neck supple. No JVD present. No tracheal deviation present. No thyromegaly present.  Cardiovascular: Normal rate, regular rhythm, normal heart sounds and intact distal pulses.  Exam reveals no gallop and no friction rub.   No murmur heard. Pulmonary/Chest: Effort normal and breath sounds normal. No stridor. No respiratory distress. He has no wheezes. He has no rales. He exhibits tenderness (tenderness to patient along left lower costal margin anteriorly).  Abdominal: Soft. Bowel sounds are normal. He exhibits no distension and no mass. There is tenderness (tender palpation in epigastrium, and left upper quadrant). There is no rebound and no guarding.  Musculoskeletal: Normal range of motion. He exhibits no edema and no tenderness.  Lymphadenopathy:    He has no cervical adenopathy.  Neurological: He is oriented to person, place, and time. He exhibits normal muscle tone. Coordination normal.  Somnolent but arousable  Skin: Skin is warm and dry. No rash noted. No erythema. No pallor.  Psychiatric: He has a normal mood and affect. His behavior  is normal. Judgment and thought content normal.    ED Course  Procedures (including critical care time) Labs Review Labs Reviewed  BASIC METABOLIC PANEL - Abnormal; Notable for the following:    GFR calc non Af Amer 75 (*)    GFR calc Af Amer 87 (*)    All other components within normal limits  CBC  PRO B NATRIURETIC PEPTIDE  URINE RAPID DRUG SCREEN (HOSP PERFORMED)  ETHANOL  HEPATIC FUNCTION PANEL  LIPASE, BLOOD  POCT I-STAT TROPONIN I   Imaging Review No  results found.  EKG Interpretation    Date/Time:  Saturday August 16 2013 04:45:38 EST Ventricular Rate:  79 PR Interval:  166 QRS Duration: 82 QT Interval:  389 QTC Calculation: 446 R Axis:   64 Text Interpretation:  Sinus rhythm Low voltage, precordial leads Normal ECG Confirmed by Nayson Traweek  MD, Angelissa Supan ([id number]) on 08/16/2013 5:05:39 AM            MDM   1. Chest wall pain   2. Upper abdominal pain    53 year old male with acute onset of chest pain.  This evening.  Patient denies alcohol use, but does have a slightly elevated alcohol tonight.  EKG without ischemia.  Initial troponin is negative.  Pain is reproducible with palpation.  We'll check baseline labs, give GI cocktail and reassess.  6:56 AM Patient noted to have positive cocaine on history and screen.  He is adamant that he has not used cocaine in the last week.  When questioned further he is adamant that he has not used it in the last 2 days.  He may have come in contact with cocaine in the last week.  Slightly elevated alcohol as well.  Patient has been sleeping comfortably since arrival.  No improvement with GI cocktail.  Pain is completely reproducible with palpation of the upper abdomen.  I do not feel that patient has cardiac ischemia.  Patient with similar presentations to the ER over the last several months.  He has had negative workup thus far.  Negative d-dimer on last visit, and negative CT angioma.  Chest on prior admission.  Pain does not seem cardiac in nature.  We'll get second troponin, and plan for discharge home if negative.  Olivia Mackielga M Cerissa Zeiger, MD 08/16/13 [phone]

## 2013-08-16 NOTE — ED Provider Notes (Signed)
8:36 AM Spoke with pt upon DC at nursing request. Pt doesn't want to be discharged. "I think I need to stay with you two, three days." Acknowledged concerns, but reassured. Suspect social issues for reluctance to be discharged. [place] and has no way to get back there. Pt has hx of drug abuse and UDS positive for cocaine today and ETOH elevated. I will not enable his poor decisions by having social work speak with him about possible transportation assistance. Outpt resources.   Raeford RazorStephen Mehran Guderian, MD 08/16/13 [phone]

## 2013-08-16 NOTE — Discharge Instructions (Signed)
Stop using cocaine.  Avoid alcohol as it may worsen symptoms.  Take medications as prescribed.  Return to the ER for worsening condition or new concerning symptoms.   Chest Wall Pain Chest wall pain is pain in or around the bones and muscles of your chest. It may take up to 6 weeks to get better. It may take longer if you must stay physically active in your work and activities.  CAUSES  Chest wall pain may happen on its own. However, it may be caused by:  A viral illness like the flu.  Injury.  Coughing.  Exercise.  Arthritis.  Fibromyalgia.  Shingles. HOME CARE INSTRUCTIONS   Avoid overtiring physical activity. Try not to strain or perform activities that cause pain. This includes any activities using your chest or your abdominal and side muscles, especially if heavy weights are used.  Put ice on the sore area.  Put ice in a plastic bag.  Place a towel between your skin and the bag.  Leave the ice on for 15-20 minutes per hour while awake for the first 2 days.  Only take over-the-counter or prescription medicines for pain, discomfort, or fever as directed by your caregiver. SEEK IMMEDIATE MEDICAL CARE IF:   Your pain increases, or you are very uncomfortable.  You have a fever.  Your chest pain becomes worse.  You have new, unexplained symptoms.  You have nausea or vomiting.  You feel sweaty or lightheaded.  You have a cough with phlegm (sputum), or you cough up blood. MAKE SURE YOU:   Understand these instructions.  Will watch your condition.  Will get help right away if you are not doing well or get worse. Document Released: 07/31/2005 Document Revised: 10/23/2011 Document Reviewed: 03/27/2011 [facility] Patient Information 2014 [place], [place].  Chest Pain Observation It is often hard to give a specific diagnosis for the cause of chest pain. Among other possibilities your symptoms might be caused by inadequate oxygen delivery to your heart (angina). Angina  that is not treated or evaluated can lead to a heart attack (myocardial infarction) or death. Blood tests, electrocardiograms, and X-rays may have been done to help determine a possible cause of your chest pain. After evaluation and observation, your health care provider has determined that it is unlikely your pain was caused by an unstable condition that requires hospitalization. However, a full evaluation of your pain may need to be completed, with additional diagnostic testing as directed. It is very important to keep your follow-up appointments. Not keeping your follow-up appointments could result in permanent heart damage, disability, or death. If there is any problem keeping your follow-up appointments, you must call your health care provider. HOME CARE INSTRUCTIONS  Due to the slight chance that your pain could be angina, it is important to follow your health care provider's treatment plan and also maintain a healthy lifestyle:  Maintain or work toward achieving a healthy weight.  Stay physically active and exercise regularly.  Decrease your salt intake.  Eat a balanced, healthy diet. Talk to a dietician to learn about heart healthy foods.  Increase your fiber intake by including whole grains, vegetables, fruits, and nuts in your diet.  Avoid situations that cause stress, anger, or depression.  Take medicines as advised by your health care provider. Report any side effects to your health care provider. Do not stop medicines or adjust the dosages on your own.  Quit smoking. Do not use nicotine patches or gum until you check with your health care  provider.  Keep your blood pressure, blood sugar, and cholesterol levels within normal limits.  Limit alcohol intake to no more than 1 drink per day for women that are not pregnant and 2 drinks per day for men.  Do not abuse drugs. SEEK IMMEDIATE MEDICAL CARE IF: You have severe chest pain or pressure which may include symptoms such  as:  You feel pain or pressure in you arms, neck, jaw, or back.  You have severe back or abdominal pain, feel sick to your stomach (nauseous), or throw up (vomit).  You are sweating profusely.  You are having a fast or irregular heartbeat.  You feel short of breath while at rest.  You notice increasing shortness of breath during rest, sleep, or with activity.  You have chest pain that does not get better after rest or after taking your usual medicine.  You wake from sleep with chest pain.  You are unable to sleep because you cannot breathe.  You develop a frequent cough or you are coughing up blood.  You feel dizzy, faint, or experience extreme fatigue.  You develop severe weakness, dizziness, fainting, or chills. Any of these symptoms may represent a serious problem that is an emergency. Do not wait to see if the symptoms will go away. Call your local emergency services (911 in the [place] Do not drive yourself to the hospital. MAKE SURE YOU:  Understand these instructions.  Will watch your condition.  Will get help right away if you are not doing well or get worse. Document Released: 09/02/2010 Document Revised: 04/02/2013 Document Reviewed: 01/30/2013 [facility] Patient Information 2014 [place], [place].     Abdominal Pain Abdominal pain can be caused by many things. Your caregiver decides the seriousness of your pain by an examination and possibly blood tests and X-rays. Many cases can be observed and treated at home. Most abdominal pain is not caused by a disease and will probably improve without treatment. However, in many cases, more time must pass before a clear cause of the pain can be found. Before that point, it may not be known if you need more testing, or if hospitalization or surgery is needed. HOME CARE INSTRUCTIONS   Do not take laxatives unless directed by your caregiver.  Take pain medicine only as directed by your caregiver.  Only take over-the-counter or  prescription medicines for pain, discomfort, or fever as directed by your caregiver.  Try a clear liquid diet (broth, tea, or water) for as long as directed by your caregiver. Slowly move to a bland diet as tolerated. SEEK IMMEDIATE MEDICAL CARE IF:   The pain does not go away.  You have a fever.  You keep throwing up (vomiting).  The pain is felt only in portions of the abdomen. Pain in the right side could possibly be appendicitis. In an adult, pain in the left lower portion of the abdomen could be colitis or diverticulitis.  You pass bloody or black tarry stools. MAKE SURE YOU:   Understand these instructions.  Will watch your condition.  Will get help right away if you are not doing well or get worse. Document Released: 05/10/2005 Document Revised: 10/23/2011 Document Reviewed: 03/18/2008 [facility] Patient Information 2014 [place], [place].

## 2013-08-16 NOTE — ED Notes (Signed)
Pt refusing to leave.  Dr Juleen ChinaKohut notified.

## 2014-02-09 ENCOUNTER — Encounter (HOSPITAL_COMMUNITY): Payer: Self-pay | Admitting: Emergency Medicine

## 2014-02-09 ENCOUNTER — Emergency Department (HOSPITAL_COMMUNITY)
Admission: EM | Admit: 2014-02-09 | Discharge: 2014-02-09 | Disposition: A | Discharge status: Home / Self Care | Payer: Medicaid Other | Attending: Emergency Medicine | Admitting: Emergency Medicine

## 2014-02-09 DIAGNOSIS — K047 Periapical abscess without sinus: Secondary | ICD-10-CM | POA: Insufficient documentation

## 2014-02-09 DIAGNOSIS — Z8659 Personal history of other mental and behavioral disorders: Secondary | ICD-10-CM | POA: Insufficient documentation

## 2014-02-09 DIAGNOSIS — Z792 Long term (current) use of antibiotics: Secondary | ICD-10-CM | POA: Insufficient documentation

## 2014-02-09 DIAGNOSIS — Z8619 Personal history of other infectious and parasitic diseases: Secondary | ICD-10-CM | POA: Insufficient documentation

## 2014-02-09 DIAGNOSIS — G8929 Other chronic pain: Secondary | ICD-10-CM | POA: Insufficient documentation

## 2014-02-09 LAB — BASIC METABOLIC PANEL
BUN: 8 mg/dL (ref 6–23)
CALCIUM: 9.5 mg/dL (ref 8.4–10.5)
CHLORIDE: 91 meq/L — AB (ref 96–112)
CO2: 25 meq/L (ref 19–32)
CREATININE: 1.05 mg/dL (ref 0.50–1.35)
GFR, EST NON AFRICAN AMERICAN: 79 mL/min — AB (ref >90)
Glucose, Bld: 124 mg/dL — ABNORMAL HIGH (ref 70–99)
Potassium: 3.5 mEq/L — ABNORMAL LOW (ref 3.7–5.3)
Sodium: 134 mEq/L — ABNORMAL LOW (ref 137–147)

## 2014-02-09 LAB — CBC WITH DIFFERENTIAL/PLATELET
Basophils Absolute: 0 10*3/uL (ref 0.0–0.1)
Basophils Relative: 0 % (ref 0–1)
Eosinophils Absolute: 0 10*3/uL (ref 0.0–0.7)
Eosinophils Relative: 0 % (ref 0–5)
HEMATOCRIT: 39.8 % (ref 39.0–52.0)
Hemoglobin: 14.2 g/dL (ref 13.0–17.0)
LYMPHS ABS: 1 10*3/uL (ref 0.7–4.0)
LYMPHS PCT: 7 % — AB (ref 12–46)
MCH: 31.8 pg (ref 26.0–34.0)
MCHC: 35.7 g/dL (ref 30.0–36.0)
MCV: 89 fL (ref 78.0–100.0)
MONO ABS: 1.6 10*3/uL — AB (ref 0.1–1.0)
MONOS PCT: 11 % (ref 3–12)
Neutro Abs: 12.5 10*3/uL — ABNORMAL HIGH (ref 1.7–7.7)
Neutrophils Relative %: 82 % — ABNORMAL HIGH (ref 43–77)
Platelets: 297 10*3/uL (ref 150–400)
RBC: 4.47 MIL/uL (ref 4.22–5.81)
RDW: 13.4 % (ref 11.5–15.5)
WBC: 15.2 10*3/uL — AB (ref 4.0–10.5)

## 2014-02-09 MED ORDER — CLINDAMYCIN HCL 150 MG PO CAPS: 300.0000 mg | ORAL_CAPSULE | Freq: Four times a day (QID) | ORAL | Status: DC

## 2014-02-09 MED ORDER — CLINDAMYCIN PHOSPHATE 600 MG/50ML IV SOLN
600.0000 mg | Freq: Once | INTRAVENOUS | Status: AC
  Administered 2014-02-09: 600 mg via INTRAVENOUS
  Filled 2014-02-09: qty 50

## 2014-02-09 MED ORDER — ACETAMINOPHEN 500 MG PO TABS
1000.0000 mg | ORAL_TABLET | Freq: Once | ORAL | Status: DC
  Filled 2014-02-09: qty 2

## 2014-02-09 MED ORDER — HYDROCODONE-ACETAMINOPHEN 5-325 MG PO TABS
1.0000 | ORAL_TABLET | Freq: Once | ORAL | Status: AC
  Administered 2014-02-09: 1 via ORAL
  Filled 2014-02-09: qty 1

## 2014-02-09 MED ORDER — ACETAMINOPHEN 325 MG PO TABS
650.0000 mg | ORAL_TABLET | Freq: Once | ORAL | Status: AC
  Administered 2014-02-09: 650 mg via ORAL
  Filled 2014-02-09: qty 2

## 2014-02-09 NOTE — ED Provider Notes (Signed)
CSN: [id number]     Arrival date & time 02/09/14  2117 History   First MD Initiated Contact with Patient 02/09/14 2205     Chief Complaint  Patient presents with  . Dental Pain     (Consider location/radiation/quality/duration/timing/severity/associated sxs/prior Treatment) HPI Comments: Patient presents to the ER for evaluation of facial pain and swelling. Patient reports that he has had a toothache for approximately a week. The pain started in his molar on the right lower side of his mouth. He saw a dentist, was started on amoxicillin. He was referred to an oral surgeon, has not seen the surgeon yet. In the last couple of days his symptoms have worsened. Pain is worse, he is now experiencing swelling of the right side of his face. He is feeling fever and chills.  Patient is a 53 y.o. male presenting with tooth pain.  Dental Pain Associated symptoms: facial swelling and fever     Past Medical History  Diagnosis Date  . Post traumatic stress disorder (PTSD)   . Tuberculosis 1995  . Chronic back pain   . Cocaine addiction    Past Surgical History  Procedure Laterality Date  . Knee surgery      L   History reviewed. No pertinent family history. History  Substance Use Topics  . Smoking status: Never Smoker   . Smokeless tobacco: Not on file  . Alcohol Use: 0.6 oz/week    1 Cans of beer per week    Review of Systems  Constitutional: Positive for fever and chills.  HENT: Positive for dental problem and facial swelling.   Respiratory: Negative for shortness of breath.   All other systems reviewed and are negative.     Allergies  Review of patient's allergies indicates no known allergies.  Home Medications   Prior to Admission medications   Medication Sig Start Date End Date Taking? Authorizing Provider  amoxicillin (AMOXIL) 500 MG capsule Take 500 mg by mouth 4 (four) times daily. For tooth/gum infection 02/07/14 02/13/14 Yes Historical Provider, MD  ibuprofen  (ADVIL,MOTRIN) 200 MG tablet Take 200 mg by mouth every 6 (six) hours as needed (tooth pain).    Yes Historical Provider, MD   BP 129/75  Pulse 100  Temp(Src) 100 F (37.8 C) (Oral)  Resp 20  Ht 6' (1.829 m)  Wt 225 lb (102.059 kg)  BMI 30.51 kg/m2  SpO2 98% Physical Exam  Constitutional: He is oriented to person, place, and time. He appears well-developed and well-nourished. No distress.  HENT:  Head: Normocephalic and atraumatic.  Right Ear: Hearing normal.  Left Ear: Hearing normal.  Nose: Nose normal.  Mouth/Throat: Oropharynx is clear and moist and mucous membranes are normal.  Eyes: Conjunctivae and EOM are normal. Pupils are equal, round, and reactive to light.  Neck: Normal range of motion. Neck supple.  Cardiovascular: Regular rhythm, S1 normal and S2 normal.  Exam reveals no gallop and no friction rub.   No murmur heard. Pulmonary/Chest: Effort normal and breath sounds normal. No respiratory distress. He exhibits no tenderness.  Abdominal: Soft. Normal appearance and bowel sounds are normal. There is no hepatosplenomegaly. There is no tenderness. There is no rebound, no guarding, no tenderness at McBurney's point and negative Murphy's sign. No hernia.  Musculoskeletal: Normal range of motion.  Neurological: He is alert and oriented to person, place, and time. He has normal strength. No cranial nerve deficit or sensory deficit. Coordination normal. GCS eye subscore is 4. GCS verbal subscore is 5. GCS  motor subscore is 6.  Skin: Skin is warm, dry and intact. No rash noted. No cyanosis.  Psychiatric: He has a normal mood and affect. His speech is normal and behavior is normal. Thought content normal.    ED Course  Procedures (including critical care time) Labs Review Labs Reviewed  CBC WITH DIFFERENTIAL  BASIC METABOLIC PANEL    Imaging Review No results found.   EKG Interpretation None      MDM   Final diagnoses:  None   dental abscess  Patient presents to  the ER for evaluation of facial swelling which is secondary to dental abscess. He has been having a toothache for approximately a week, was started on amoxicillin. His symptoms have worsened rather than improved. He did examine the inside portions of the patient's mouth, no fluctuant fluid collections or drainable. He does not have any airway compromise. Patient was initiated on clindamycin IV, will switch to by mouth clindamycin as an outpatient.    Gilda Creasehristopher J. Pollina, MD 02/09/14 2249

## 2014-02-09 NOTE — ED Notes (Signed)
Patient presents via EMS with c/o right bottom dental pain.  Face swollen on the right side.  Has been taking Amoxicilin since 6/27 without relief.  Has been taking Motrin for the pain but none since noon.  EMS reports BP 140/90 P110 R14

## 2014-02-09 NOTE — Discharge Instructions (Signed)

## 2014-02-09 NOTE — ED Notes (Signed)
Pt A&OX4, ambulatory at d/c with steady gait, NAD, declined wheelchair. 

## 2014-05-18 ENCOUNTER — Emergency Department (HOSPITAL_COMMUNITY): Payer: No Typology Code available for payment source

## 2014-05-18 ENCOUNTER — Encounter (HOSPITAL_COMMUNITY): Payer: Self-pay | Admitting: Emergency Medicine

## 2014-05-18 ENCOUNTER — Emergency Department (HOSPITAL_COMMUNITY)
Admission: EM | Admit: 2014-05-18 | Discharge: 2014-05-18 | Disposition: A | Discharge status: Home / Self Care | Payer: No Typology Code available for payment source | Attending: Emergency Medicine | Admitting: Emergency Medicine

## 2014-05-18 DIAGNOSIS — M25561 Pain in right knee: Secondary | ICD-10-CM

## 2014-05-18 DIAGNOSIS — Y92410 Unspecified street and highway as the place of occurrence of the external cause: Secondary | ICD-10-CM | POA: Diagnosis not present

## 2014-05-18 DIAGNOSIS — G8929 Other chronic pain: Secondary | ICD-10-CM | POA: Insufficient documentation

## 2014-05-18 DIAGNOSIS — Y9389 Activity, other specified: Secondary | ICD-10-CM | POA: Insufficient documentation

## 2014-05-18 DIAGNOSIS — S3982XA Other specified injuries of lower back, initial encounter: Secondary | ICD-10-CM | POA: Diagnosis present

## 2014-05-18 DIAGNOSIS — Z8659 Personal history of other mental and behavioral disorders: Secondary | ICD-10-CM | POA: Insufficient documentation

## 2014-05-18 DIAGNOSIS — M545 Low back pain: Secondary | ICD-10-CM

## 2014-05-18 DIAGNOSIS — Z8611 Personal history of tuberculosis: Secondary | ICD-10-CM | POA: Insufficient documentation

## 2014-05-18 HISTORY — DX: Other chronic pain: G89.29

## 2014-05-18 HISTORY — DX: Pain in unspecified knee: M25.569

## 2014-05-18 LAB — COMPREHENSIVE METABOLIC PANEL
ALBUMIN: 3.8 g/dL (ref 3.5–5.2)
ALK PHOS: 91 U/L (ref 39–117)
ALT: 23 U/L (ref 0–53)
AST: 19 U/L (ref 0–37)
Anion gap: 15 (ref 5–15)
BUN: 8 mg/dL (ref 6–23)
CALCIUM: 8.9 mg/dL (ref 8.4–10.5)
CO2: 23 mEq/L (ref 19–32)
Chloride: 106 mEq/L (ref 96–112)
Creatinine, Ser: 1.13 mg/dL (ref 0.50–1.35)
GFR calc non Af Amer: 72 mL/min — ABNORMAL LOW (ref >90)
GFR, EST AFRICAN AMERICAN: 83 mL/min — AB (ref >90)
GLUCOSE: 96 mg/dL (ref 70–99)
POTASSIUM: 3.9 meq/L (ref 3.7–5.3)
Sodium: 144 mEq/L (ref 137–147)
TOTAL PROTEIN: 7.3 g/dL (ref 6.0–8.3)
Total Bilirubin: 0.3 mg/dL (ref 0.3–1.2)

## 2014-05-18 LAB — CBC
HEMATOCRIT: 38.7 % — AB (ref 39.0–52.0)
HEMOGLOBIN: 13.5 g/dL (ref 13.0–17.0)
MCH: 30.6 pg (ref 26.0–34.0)
MCHC: 34.9 g/dL (ref 30.0–36.0)
MCV: 87.8 fL (ref 78.0–100.0)
Platelets: 226 10*3/uL (ref 150–400)
RBC: 4.41 MIL/uL (ref 4.22–5.81)
RDW: 13.8 % (ref 11.5–15.5)
WBC: 4.1 10*3/uL (ref 4.0–10.5)

## 2014-05-18 LAB — PROTIME-INR
INR: 0.99 (ref 0.00–1.49)
PROTHROMBIN TIME: 13.1 s (ref 11.6–15.2)

## 2014-05-18 LAB — TYPE AND SCREEN
ABO/RH(D): O NEG
ANTIBODY SCREEN: NEGATIVE

## 2014-05-18 LAB — CDS SEROLOGY

## 2014-05-18 LAB — ABO/RH: ABO/RH(D): O NEG

## 2014-05-18 LAB — ETHANOL: ALCOHOL ETHYL (B): 46 mg/dL — AB (ref 0–11)

## 2014-05-18 MED ORDER — FENTANYL CITRATE 0.05 MG/ML IJ SOLN
75.0000 ug | Freq: Once | INTRAMUSCULAR | Status: AC
  Administered 2014-05-18: 75 ug via INTRAVENOUS

## 2014-05-18 MED ORDER — FENTANYL CITRATE 0.05 MG/ML IJ SOLN
INTRAMUSCULAR | Status: AC
  Administered 2014-05-18: 75 ug via INTRAVENOUS
  Filled 2014-05-18: qty 2

## 2014-05-18 MED ORDER — SODIUM CHLORIDE 0.9 % IV BOLUS (SEPSIS)
500.0000 mL | Freq: Once | INTRAVENOUS | Status: AC
  Administered 2014-05-18: 500 mL via INTRAVENOUS

## 2014-05-18 MED ORDER — IBUPROFEN 400 MG PO TABS: 400.0000 mg | ORAL_TABLET | Freq: Four times a day (QID) | ORAL | Status: DC | PRN

## 2014-05-18 MED ORDER — HYDROMORPHONE HCL 1 MG/ML IJ SOLN
INTRAMUSCULAR | Status: AC
  Filled 2014-05-18: qty 1

## 2014-05-18 MED ORDER — HYDROCODONE-ACETAMINOPHEN 5-325 MG PO TABS: 1.0000 | ORAL_TABLET | Freq: Four times a day (QID) | ORAL | Status: DC | PRN

## 2014-05-18 MED ORDER — IOHEXOL 300 MG/ML  SOLN
100.0000 mL | Freq: Once | INTRAMUSCULAR | Status: AC | PRN
  Administered 2014-05-18: 100 mL via INTRAVENOUS

## 2014-05-18 MED ORDER — HYDROMORPHONE HCL 1 MG/ML IJ SOLN: 1.0000 mg | Freq: Once | INTRAMUSCULAR | Status: DC
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
## 2014-05-18 NOTE — ED Notes (Signed)
Pt belongings, a black cell phone, a gold colored chain and a hat were returned to patient from EMS. Pt agrees that all belongings are at bedside at this time.
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
## 2014-05-19 NOTE — ED Provider Notes (Signed)
I saw and evaluated the patient, reviewed the resident's note and I agree with the findings and plan.   EKG Interpretation None       Gamal Todisco T Daemien Fronczak, MD 05/19/14 1219 

## 2014-06-18 ENCOUNTER — Emergency Department (HOSPITAL_COMMUNITY)
Admission: EM | Admit: 2014-06-18 | Discharge: 2014-06-18 | Disposition: A | Discharge status: Home / Self Care | Payer: Medicaid Other | Attending: Emergency Medicine | Admitting: Emergency Medicine

## 2014-06-18 ENCOUNTER — Encounter (HOSPITAL_COMMUNITY): Payer: Self-pay | Admitting: Emergency Medicine

## 2014-06-18 DIAGNOSIS — Z8659 Personal history of other mental and behavioral disorders: Secondary | ICD-10-CM | POA: Diagnosis not present

## 2014-06-18 DIAGNOSIS — F1410 Cocaine abuse, uncomplicated: Secondary | ICD-10-CM | POA: Insufficient documentation

## 2014-06-18 DIAGNOSIS — Z8611 Personal history of tuberculosis: Secondary | ICD-10-CM | POA: Diagnosis not present

## 2014-06-18 DIAGNOSIS — G8929 Other chronic pain: Secondary | ICD-10-CM | POA: Insufficient documentation

## 2014-06-18 DIAGNOSIS — M549 Dorsalgia, unspecified: Secondary | ICD-10-CM | POA: Insufficient documentation

## 2014-06-18 LAB — COMPREHENSIVE METABOLIC PANEL
ALT: 26 U/L (ref 0–53)
ANION GAP: 11 (ref 5–15)
AST: 20 U/L (ref 0–37)
Albumin: 4.4 g/dL (ref 3.5–5.2)
Alkaline Phosphatase: 115 U/L (ref 39–117)
BUN: 10 mg/dL (ref 6–23)
CALCIUM: 9.6 mg/dL (ref 8.4–10.5)
CO2: 28 mEq/L (ref 19–32)
CREATININE: 1.06 mg/dL (ref 0.50–1.35)
Chloride: 101 mEq/L (ref 96–112)
GFR calc non Af Amer: 78 mL/min — ABNORMAL LOW (ref >90)
GLUCOSE: 101 mg/dL — AB (ref 70–99)
Potassium: 4.2 mEq/L (ref 3.7–5.3)
SODIUM: 140 meq/L (ref 137–147)
TOTAL PROTEIN: 8.2 g/dL (ref 6.0–8.3)
Total Bilirubin: 0.4 mg/dL (ref 0.3–1.2)

## 2014-06-18 LAB — CBC
HCT: 41.7 % (ref 39.0–52.0)
HEMOGLOBIN: 14.3 g/dL (ref 13.0–17.0)
MCH: 31.1 pg (ref 26.0–34.0)
MCHC: 34.3 g/dL (ref 30.0–36.0)
MCV: 90.7 fL (ref 78.0–100.0)
Platelets: 240 10*3/uL (ref 150–400)
RBC: 4.6 MIL/uL (ref 4.22–5.81)
RDW: 14 % (ref 11.5–15.5)
WBC: 6.1 10*3/uL (ref 4.0–10.5)

## 2014-06-18 LAB — ETHANOL: Alcohol, Ethyl (B): <11 mg/dL (ref 0–11)

## 2014-06-18 LAB — SALICYLATE LEVEL

## 2014-06-18 LAB — ACETAMINOPHEN LEVEL: Acetaminophen (Tylenol), Serum: <15 ug/mL (ref 10–30)

## 2014-06-18 NOTE — ED Notes (Signed)
Pt made aware of need for urine specimen 

## 2014-06-18 NOTE — ED Notes (Addendum)
Pt brought to ED by [employer] voluntary for assistance in detox from cocaine. [facility]. Pt used @ $100 worth of cocaine yesterday. Pt denies SI/HI

## 2014-06-18 NOTE — Discharge Instructions (Signed)
It was our pleasure to provide your ER care today - we hope that you feel better.  Rest. Drink plenty of fluids. Use resource guide provided for community support and resources, including for any substance abuse issues.  If mental health issues and/or crisis, go directly to [facility] - see referral.  For medical care/primary care, follow up at [facility] - see referral.  Return to ER if worse, new symptoms, fevers, medical emergency, other concern.     Stimulant Use Disorder-Cocaine Cocaine is one of a group of powerful drugs called stimulants. Cocaine has medical uses for stopping nosebleeds and for pain control before minor nose or dental surgery. However, cocaine is misused because of the effects that it produces. These effects include:   A feeling of extreme pleasure.  Alertness.  High energy. Common street names for cocaine include coke, crack, blow, snow, and nose candy. Cocaine is snorted, dissolved in water and injected, or smoked.  Stimulants are addictive because they activate regions of the brain that produce both the pleasurable sensation of "reward" and psychological dependence. Together, these actions account for loss of control and the rapid development of drug dependence. This means you become ill without the drug (withdrawal) and need to keep using it to function.  Stimulant use disorder is use of stimulants that disrupts your daily life. It disrupts relationships with family and friends and how you do your job. Cocaine increases your blood pressure and heart rate. It can cause a heart attack or stroke. Cocaine can also cause death from irregular heart rate or seizures. SYMPTOMS Symptoms of stimulant use disorder with cocaine include:  Use of cocaine in larger amounts or over a longer period of time than intended.  Unsuccessful attempts to cut down or control cocaine use.  A lot of time spent obtaining, using, or recovering from the effects of  cocaine.  A strong desire or urge to use cocaine (craving).  Continued use of cocaine in spite of major problems at work, school, or home because of use.  Continued use of cocaine in spite of relationship problems because of use.  Giving up or cutting down on important life activities because of cocaine use.  Use of cocaine over and over in situations when it is physically hazardous, such as driving a car.  Continued use of cocaine in spite of a physical problem that is likely related to use. Physical problems can include:  Malnutrition.  Nosebleeds.  Chest pain.  High blood pressure.  A hole that develops between the part of your nose that separates your nostrils (perforated nasal septum).  Lung and kidney damage.  Continued use of cocaine in spite of a mental problem that is likely related to use. Mental problems can include:  Schizophrenia-like symptoms.  Depression.  Bipolar mood swings.  Anxiety.  Sleep problems.  Need to use more and more cocaine to get the same effect, or lessened effect over time with use of the same amount of cocaine (tolerance).  Having withdrawal symptoms when cocaine use is stopped, or using cocaine to reduce or avoid withdrawal symptoms. Withdrawal symptoms include:  Depressed or irritable mood.  Low energy or restlessness.  Bad dreams.  Poor or excessive sleep.  Increased appetite. DIAGNOSIS Stimulant use disorder is diagnosed by your health care provider. You may be asked questions about your cocaine use and how it affects your life. A physical exam may be done. A drug screen may be ordered. You may be referred to a mental  health professional. The diagnosis of stimulant use disorder requires at least two symptoms within 12 months. The type of stimulant use disorder depends on the number of signs and symptoms you have. The type may be:  Mild. Two or three signs and symptoms.  Moderate. Four or five signs and symptoms.  Severe.  Six or more signs and symptoms. TREATMENT Treatment for stimulant use disorder is usually provided by mental health professionals with training in substance use disorders. The following options are available:  Counseling or talk therapy. Talk therapy addresses the reasons you use cocaine and ways to keep you from using again. Goals of talk therapy include:  Identifying and avoiding triggers for use.  Handling cravings.  Replacing use with healthy activities.  Support groups. Support groups provide emotional support, advice, and guidance.  Medicine. Certain medicines may decrease cocaine cravings or withdrawal symptoms. HOME CARE INSTRUCTIONS  Take medicines only as directed by your health care provider.  Identify the people and activities that trigger your cocaine use and avoid them.  Keep all follow-up visits as directed by your health care provider. SEEK MEDICAL CARE IF:  Your symptoms get worse or you relapse.  You are not able to take medicines as directed. SEEK IMMEDIATE MEDICAL CARE IF:  You have serious thoughts about hurting yourself or others.  You have a seizure, chest pain, sudden weakness, or loss of speech or vision. FOR MORE INFORMATION  National Institute on Drug Abuse: http://www.price-smith.com/  Substance Abuse and Mental Health Services Administration: SkateOasis.com.pt Document Released: 07/28/2000 Document Revised: 12/15/2013 Document Reviewed: 08/13/2013 [facility] Patient Information 2015 [place], [place]. This information is not intended to replace advice given to you by your health care provider. Make sure you discuss any questions you have with your health care provider.     Stress and Stress Management Stress is a normal reaction to life events. It is what you feel when life demands more than you are used to or more than you can handle. Some stress can be useful. For example, the stress reaction can help you catch the last bus of the day, study for a test, or  meet a deadline at work. But stress that occurs too often or for too long can cause problems. It can affect your emotional health and interfere with relationships and normal daily activities. Too much stress can weaken your immune system and increase your risk for physical illness. If you already have a medical problem, stress can make it worse. CAUSES  All sorts of life events may cause stress. An event that causes stress for one person may not be stressful for another person. Major life events commonly cause stress. These may be positive or negative. Examples include losing your job, moving into a new home, getting married, having a baby, or losing a loved one. Less obvious life events may also cause stress, especially if they occur day after day or in combination. Examples include working long hours, driving in traffic, caring for children, being in debt, or being in a difficult relationship. SIGNS AND SYMPTOMS Stress may cause emotional symptoms including, the following:  Anxiety. This is feeling worried, afraid, on edge, overwhelmed, or out of control.  Anger. This is feeling irritated or impatient.  Depression. This is feeling sad, down, helpless, or guilty.  Difficulty focusing, remembering, or making decisions. Stress may cause physical symptoms, including the following:   Aches and pains. These may affect your head, neck, back, stomach, or other areas of your body.  Tight muscles  or clenched jaw.  Low energy or trouble sleeping. Stress may cause unhealthy behaviors, including the following:   Eating to feel better (overeating) or skipping meals.  Sleeping too little, too much, or both.  Working too much or putting off tasks (procrastination).  Smoking, drinking alcohol, or using drugs to feel better. DIAGNOSIS  Stress is diagnosed through an assessment by your health care provider. Your health care provider will ask questions about your symptoms and any stressful life  events.Your health care provider will also ask about your medical history and may order blood tests or other tests. Certain medical conditions and medicine can cause physical symptoms similar to stress. Mental illness can cause emotional symptoms and unhealthy behaviors similar to stress. Your health care provider may refer you to a mental health professional for further evaluation.  TREATMENT  Stress management is the recommended treatment for stress.The goals of stress management are reducing stressful life events and coping with stress in healthy ways.  Techniques for reducing stressful life events include the following:  Stress identification. Self-monitor for stress and identify what causes stress for you. These skills may help you to avoid some stressful events.  Time management. Set your priorities, keep a calendar of events, and learn to say "no." These tools can help you avoid making too many commitments. Techniques for coping with stress include the following:  Rethinking the problem. Try to think realistically about stressful events rather than ignoring them or overreacting. Try to find the positives in a stressful situation rather than focusing on the negatives.  Exercise. Physical exercise can release both physical and emotional tension. The key is to find a form of exercise you enjoy and do it regularly.  Relaxation techniques. These relax the body and mind. Examples include yoga, meditation, tai chi, biofeedback, deep breathing, progressive muscle relaxation, listening to music, being out in nature, journaling, and other hobbies. Again, the key is to find one or more that you enjoy and can do regularly.  Healthy lifestyle. Eat a balanced diet, get plenty of sleep, and do not smoke. Avoid using alcohol or drugs to relax.  Strong support network. Spend time with family, friends, or other people you enjoy being around.Express your feelings and talk things over with someone you  trust. Counseling or talktherapy with a mental health professional may be helpful if you are having difficulty managing stress on your own. Medicine is typically not recommended for the treatment of stress.Talk to your health care provider if you think you need medicine for symptoms of stress. HOME CARE INSTRUCTIONS  Keep all follow-up visits as directed by your health care provider.  Take all medicines as directed by your health care provider. SEEK MEDICAL CARE IF:  Your symptoms get worse or you start having new symptoms.  You feel overwhelmed by your problems and can no longer manage them on your own. SEEK IMMEDIATE MEDICAL CARE IF:  You feel like hurting yourself or someone else. Document Released: 01/24/2001 Document Revised: 12/15/2013 Document Reviewed: 03/25/2013 [facility] Patient Information 2015 [place], [place]. This information is not intended to replace advice given to you by your health care provider. Make sure you discuss any questions you have with your health care provider.    Chronic Back Pain  When back pain lasts longer than 3 months, it is called chronic back pain.People with chronic back pain often go through certain periods that are more intense (flare-ups).  CAUSES Chronic back pain can be caused by wear and tear (degeneration) on different  structures in your back. These structures include:  The bones of your spine (vertebrae) and the joints surrounding your spinal cord and nerve roots (facets).  The strong, fibrous tissues that connect your vertebrae (ligaments). Degeneration of these structures may result in pressure on your nerves. This can lead to constant pain. HOME CARE INSTRUCTIONS  Avoid bending, heavy lifting, prolonged sitting, and activities which make the problem worse.  Take brief periods of rest throughout the day to reduce your pain. Lying down or standing usually is better than sitting while you are resting.  Take over-the-counter or  prescription medicines only as directed by your caregiver. SEEK IMMEDIATE MEDICAL CARE IF:   You have weakness or numbness in one of your legs or feet.  You have trouble controlling your bladder or bowels.  You have nausea, vomiting, abdominal pain, shortness of breath, or fainting. Document Released: 09/07/2004 Document Revised: 10/23/2011 Document Reviewed: 07/15/2011 [facility] Patient Information 2015 [place], [place]. This information is not intended to replace advice given to you by your health care provider. Make sure you discuss any questions you have with your health care provider.     Emergency Department Resource Guide 1) Find a Doctor and Pay Out of Pocket Although you won't have to find out who is covered by your insurance plan, it is a good idea to ask around and get recommendations. You will then need to call the office and see if the doctor you have chosen will accept you as a new patient and what types of options they offer for patients who are self-pay. Some doctors offer discounts or will set up payment plans for their patients who do not have insurance, but you will need to ask so you aren't surprised when you get to your appointment.  2) Contact Your Local Health Department Not all health departments have doctors that can see patients for sick visits, but many do, so it is worth a call to see if yours does. If you don't know where your local health department is, you can check in your phone book. The CDC also has a tool to help you locate your state's health department, and many state websites also have listings of all of their local health departments.  3) Find a [facility] If your illness is not likely to be very severe or complicated, you may want to try a walk in clinic. These are popping up all over the country in pharmacies, drugstores, and shopping centers. They're usually staffed by nurse practitioners or physician assistants that have been trained to treat common  illnesses and complaints. They're usually fairly quick and inexpensive. However, if you have serious medical issues or chronic medical problems, these are probably not your best option.  No Primary Care Doctor: - Call Health Connect at  [phone] - they can help you locate a primary care doctor that  accepts your insurance, provides certain services, etc. - Physician Referral Service- [phone]  Chronic Pain Problems: Organization         Address  Phone   Notes  [facility]  [phone] Patients need to be referred by their primary care doctor.   Medication Assistance: Organization         Address  Phone   Notes  [facility] Medication [facility] [place] [place], [place] [address] [phone] --Must be a resident of [facility] -- Must have NO insurance coverage whatsoever (no Medicaid/ Medicare, etc.) -- The pt. MUST have  a primary care doctor that directs their care regularly and follows them in the community   MedAssist  [phone]   [employer]  [phone]    Agencies that provide inexpensive medical care: Organization         Address  Phone   Notes  [place]  [phone]   [facility]    [phone]   [facility] [place], [place] [address] [phone]   [place] [address] [place] [phone]   Planned Parenthood    [phone]   [facility]    [phone]   [place] and [place] [address], [place] Phone:  [phone], Fax:  [phone] Hours of Operation:  9 am - 6 pm, M-F.  Also accepts Medicaid/Medicare and self-pay.  [facility] for [place], [address], [place] Phone: [phone], Fax: [phone]. Hours of Operation:  8:30 am - 5:30 pm, M-F.  Also accepts Medicaid and self-pay.  [facility]  [address], [place] Phone: [phone]   [place], [place], [place] [phone], Ext. [phone] Mondays & Thursdays: 7-9 AM.  First 15 patients are seen on a first come, first serve basis.    [place] Providers:  Organization         Address  Phone   Notes  [facility] [address], [place] [phone] Also accepts self-pay patients.  [facility] [address], [place]  [phone]   [place], [address], [place] [phone]   [facility] [address] [place] [phone]   Lucianne Lei [address], [place]   [phone] Only accepts [place] Access [place] patients after they have their name applied to their card.   Self-Pay (no insurance) in [facility]:  Organization         Address  Phone   Notes  Sickle Cell Patients, [facility] [place] [phone]   [facility] [place] [phone]   [facility] [place]  [place], [place], [place] [phone]   [facility]/Dr. Osei-Bonsu  [address] [place] or [address], [place] [phone] Phone number for both [place] and [place] locations is the same.  Urgent Medical and [facility] [address] [place] [phone]   [facility] [address], [place] or [address] [phone]   [facility] [address], [place] [phone], phone; [phone], fax Sees patients 1st and 3rd Saturday of every month.  Must not qualify for public or private insurance (i.e. Medicaid, Medicare, [place] Health Choice, Veterans' Benefits)  Household income should be no more than 200% of the  poverty level The clinic cannot treat you if you are pregnant or think you are pregnant  Sexually transmitted diseases are not treated at the clinic.    Dental Care: Organization         Address  Phone  Notes  [facility] [address] [place], [place] (  [phone] Accepts children up to age 63 who are enrolled in Medicaid or [place]; pregnant women with a Medicaid card; and children who have applied for Medicaid or [place] Health Choice, but were declined, whose parents can pay a reduced fee at time of service.  [facility] Department of [facility]  [address], [place] [phone] Accepts children up to age 105 who are enrolled in [place] or [place]; pregnant women with a Medicaid card; and children who have applied for Medicaid or [place] Health Choice, but were declined, whose parents can pay a reduced fee at time of service.  [place] Adult Dental Access PROGRAM  [place] [phone] Patients are seen by appointment only. Walk-ins are not accepted. [place] will see patients 28 years of age and older. Monday - Tuesday (8am-5pm) Most Wednesdays (8:30-5pm) $30 per visit, cash only  [facility] Adult Dental Access PROGRAM  [address], [facility] [phone] Patients are seen by appointment only. Walk-ins are not accepted. [place] will see patients 59 years of age and older. One Wednesday Evening (Monthly: Volunteer Based).  $30 per visit, cash only  [place]  [phone] for adults; Children under age 16, call Graduate Pediatric Dentistry at [phone]. Children aged 19-14, please call [phone] to request a pediatric application.  Dental services are provided in all areas of dental care including fillings, crowns and bridges, complete and partial dentures, implants, gum treatment, root canals, and extractions.  Preventive care is also provided. Treatment is provided to both adults and children. Patients are selected via a lottery and there is often a waiting list.   [facility] [address] [place]  [phone] [email or web address]   [facility] [address] [place], [place] [phone], Ext. [phone] Second and Fourth Thursday of each month, opens at 6:30 AM; Clinic ends at 9 AM.  Patients are seen on a first-come first-served basis, and a limited number are seen during each clinic.   [facility]  [address] Hillard Danker [place], [place] [phone]   Eligibility Requirements You must have lived in [place], [place], or [place] counties for at least the last three months.   You cannot be eligible for state or federal sponsored [employer], including [employer], [place], or [employer].   You generally cannot be eligible for healthcare insurance through your employer.    How to apply: Eligibility screenings are held every Tuesday and Wednesday afternoon from 1:00 pm until 4:00 pm. You do not need an appointment for the interview!  [facility] [address], [place], [place]   [place]  [place] Department  [place]  [phone]    Behavioral Health Resources in the Community: Intensive Outpatient Programs Organization         Address  Phone  Notes  [place] [facility] [place]. [address], [place], [place] [phone]   [facility] [address], [place], [place]   ADS: Alcohol & Drug S[facility] [address], [place], [place]   [place] [address]  [place], [place] or [phone]   Substance Abuse Resources Organization         Address  Phone  Notes  Alcohol and Drug Services  [phone]  [place]  [phone]   [place]   Chinita Pester  [phone]   Residential & Outpatient Substance Abuse Program  [phone]   Psychological Services Organization         Address  Phone  Notes  [facility] [place]  [place]  [phone]   [place] [address] [place] or [phone]    Mobile Crisis Teams Organization         Address  Phone  Notes  [facility], Mobile Crisis Care Unit  [phone]   [facility]  [address] [place], [place]   [place] [address] [place] [phone]    Self-Help/Support Groups Organization         Address  Phone             Notes  [place]. of [place] - variety of support groups  [place]are Call for more information  Narcotics Anonymous (NA), [facility] [address], [employer] [place]  2 meetings at this location   Special educational needs teacher         Address  Phone  Notes  ASAP Residential Treatment [place],    [place]  [phone]   [facility]  [address], [place] [address], [place], [place]   [place], [place] [phone] Admissions: 8am-3pm M-F  Incentives Substance [place] [address]    [place], [place] [phone]   [facility] [address] [place], [place], [place]   [facility] [address],  [place], [place]   [facility] - Intensive Outpatient [address], [place], [place]   [facility] [place].,  [place], [place] [phone] or [phone]   [facility] Services (RTS) [address] [place], [place] Accepts Medicaid  [facility] [place] [address]  [place]  [phone] Substance Abuse/Addiction Treatment   [facility] Organization         Address  Phone  Notes  [facility]  [phone]   Domenic Schwab, PhD [address] Arlis Porta [place], [place]   [phone] or [phone]   [place], [place] [phone]   [facility] [address] [place], [place] [phone] Insurance/Medicaid/sponsorship through [facility] and Families [address] [place]                                    [place], [place] [phone] [place] [address], [place] [phone]    Dr. Adele Schilder  [phone]   [facility] [place] Dept. 1) [address] [place] 2) [place] 3)  [address], [place] [phone]  [phone]   [place] [phone] or [phone] (After Hours)

## 2014-06-18 NOTE — ED Notes (Signed)
Pt very disappointed.  States he may do criminal activity to get the money to do drugs and that he should go back to using since we are not helping him.  Pt felt discrimination against him.  Explained that his discharge planning included resources for him to get help and that we were not ignoring what he is here for.  However, MD felt he was able to d/c instead of doing inpatient therapy.  Pt wants to go to jail for 30 days.  States he does not have any place to go.  Said he had told [employer] that they could claim he hit them just to take him to jail when they brought him in.  Pt calm yet refused to sign d/c.
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
## 2014-09-07 ENCOUNTER — Emergency Department (HOSPITAL_COMMUNITY)
Admission: EM | Admit: 2014-09-07 | Discharge: 2014-09-07 | Disposition: A | Discharge status: Home / Self Care | Payer: No Typology Code available for payment source | Attending: Emergency Medicine | Admitting: Emergency Medicine

## 2014-09-07 ENCOUNTER — Emergency Department (HOSPITAL_COMMUNITY): Payer: No Typology Code available for payment source

## 2014-09-07 ENCOUNTER — Encounter (HOSPITAL_COMMUNITY): Payer: Self-pay | Admitting: Emergency Medicine

## 2014-09-07 DIAGNOSIS — Y9389 Activity, other specified: Secondary | ICD-10-CM | POA: Insufficient documentation

## 2014-09-07 DIAGNOSIS — S93401A Sprain of unspecified ligament of right ankle, initial encounter: Secondary | ICD-10-CM

## 2014-09-07 DIAGNOSIS — Y9289 Other specified places as the place of occurrence of the external cause: Secondary | ICD-10-CM | POA: Insufficient documentation

## 2014-09-07 DIAGNOSIS — Z8611 Personal history of tuberculosis: Secondary | ICD-10-CM | POA: Insufficient documentation

## 2014-09-07 DIAGNOSIS — Y998 Other external cause status: Secondary | ICD-10-CM | POA: Insufficient documentation

## 2014-09-07 DIAGNOSIS — Z7982 Long term (current) use of aspirin: Secondary | ICD-10-CM | POA: Insufficient documentation

## 2014-09-07 DIAGNOSIS — Z8659 Personal history of other mental and behavioral disorders: Secondary | ICD-10-CM | POA: Insufficient documentation

## 2014-09-07 DIAGNOSIS — W000XXA Fall on same level due to ice and snow, initial encounter: Secondary | ICD-10-CM | POA: Insufficient documentation

## 2014-09-07 DIAGNOSIS — Z79899 Other long term (current) drug therapy: Secondary | ICD-10-CM | POA: Insufficient documentation

## 2014-09-07 DIAGNOSIS — G8929 Other chronic pain: Secondary | ICD-10-CM | POA: Insufficient documentation

## 2014-09-07 MED ORDER — IBUPROFEN 800 MG PO TABS: 800.0000 mg | ORAL_TABLET | Freq: Three times a day (TID) | ORAL | Status: DC

## 2014-09-07 MED ORDER — IBUPROFEN 800 MG PO TABS
800.0000 mg | ORAL_TABLET | Freq: Once | ORAL | Status: AC
  Administered 2014-09-07: 800 mg via ORAL
  Filled 2014-09-07: qty 1

## 2014-09-07 NOTE — Discharge Instructions (Signed)

## 2014-09-07 NOTE — ED Notes (Signed)
Patient transported to X-ray 

## 2014-09-07 NOTE — ED Notes (Signed)
Pt argumentative to staff and ortho tech states that there is no way there is nothing wrong with his foot. Expressed that the x ray did not shown a fracture or a break. We would place an splint on that could be removed. Educated pt that with an injury there can be swelling that may cause pain. Shown pt on d/c instructions that he can follow up with ortho and pain meds.given crutches also to help assit with walking. Pt states that this is not good enough for him and he will contact his lawyer. Informed PA he spoke with pt.

## 2014-09-07 NOTE — ED Provider Notes (Signed)
CSN: [id number]     Arrival date & time 09/07/14  1214 History  This chart was scribed for non-physician practitioner, Fayrene HelperBowie Haytham Maher, PA-C working with Linwood DibblesJon Knapp, MD by Greggory StallionKayla Andersen, ED scribe. This patient was seen in room WTR7/WTR7 and the patient's care was started at 12:58 PM.   Chief Complaint  Patient presents with  . Ankle Pain   The history is provided by the patient. No language interpreter was used.    HPI Comments: Ian Wolf is a 54 y.o. male who presents to the Emergency Department complaining of right ankle injury that occurred prior to arrival. States he slipped on ice and fell onto his ankle. Denies hitting his head or LOC. Reports sudden onset pain with associated swelling. Rates pain 10/10 but states bearing weight and movements worsen it. Denies new knee pain. No other injury.  No specific treatment tried.    Past Medical History  Diagnosis Date  . Post traumatic stress disorder (PTSD)   . Tuberculosis 1995  . Chronic back pain   . Cocaine addiction   . Chronic back pain   . Chronic knee pain    Past Surgical History  Procedure Laterality Date  . Knee surgery      L   No family history on file. History  Substance Use Topics  . Smoking status: Never Smoker   . Smokeless tobacco: Not on file  . Alcohol Use: 0.6 oz/week    1 Cans of beer per week    Review of Systems  Musculoskeletal: Positive for joint swelling and arthralgias.  All other systems reviewed and are negative.  Allergies  Review of patient's allergies indicates no known allergies.  Home Medications   Prior to Admission medications   Medication Sig Start Date End Date Taking? Authorizing Provider  acetaminophen (TYLENOL) 500 MG tablet Take 500 mg by mouth every 6 (six) hours as needed for mild pain.    Historical Provider, MD  Aspirin-Acetaminophen (GOODY BODY PAIN) 500-325 MG PACK Take 1 Package by mouth daily as needed (pain).    Historical Provider, MD  HYDROcodone-acetaminophen  (NORCO/VICODIN) 5-325 MG per tablet Take 1 tablet by mouth every 6 (six) hours as needed for moderate pain or severe pain. 05/18/14   Maris BergerJonah Gunalda, MD  ibuprofen (ADVIL,MOTRIN) 200 MG tablet Take 200 mg by mouth every 6 (six) hours as needed (tooth pain).     Historical Provider, MD  ibuprofen (ADVIL,MOTRIN) 400 MG tablet Take 1 tablet (400 mg total) by mouth every 6 (six) hours as needed. 05/18/14   Maris BergerJonah Gunalda, MD  Polyvinyl Alcohol-Povidone (REFRESH OP) Place 1 drop into both eyes daily as needed (irritation/dry eyes).    Historical Provider, MD   BP 133/81 mmHg  Pulse 78  Temp(Src) 97.5 F (36.4 C) (Oral)  Resp 16  SpO2 100%   Physical Exam  Constitutional: He is oriented to person, place, and time. He appears well-developed and well-nourished. No distress.  HENT:  Head: Normocephalic and atraumatic.  Eyes: Conjunctivae and EOM are normal.  Neck: Neck supple. No tracheal deviation present.  Cardiovascular: Normal rate.   Pulmonary/Chest: Effort normal. No respiratory distress.  Musculoskeletal:  Tenderness to palpation to medial malleolar region on right ankle. No crepitus or step offs. Pain with dorsiflexion and plantarflexion. Pain with eversion and inversion. Distal pulses intact.  Neurological: He is alert and oriented to person, place, and time.  Skin: Skin is warm and dry.  Psychiatric: He has a normal mood and affect. His behavior  is normal.  Nursing note and vitals reviewed.   ED Course  Procedures (including critical care time)  DIAGNOSTIC STUDIES: Oxygen Saturation is 100% on RA, normal by my interpretation.    COORDINATION OF CARE: 1:00 PM-Discussed treatment plan which includes xray with pt at bedside and pt agreed to plan.   1:12 PM Mechanical fall.  R ankle injury, xray neg for acute fx/dislocation.  ASO provided for stability and support.  RICE therapy discussed.  Ortho referral given as needed.  Pt is NVI.    Labs Review Labs Reviewed - No data to  display  Imaging Review Dg Ankle Complete Right  09/07/2014   CLINICAL DATA:  Right ankle pain and limited range of motion secondary to a fall today.  EXAM: RIGHT ANKLE - COMPLETE 3+ VIEW  COMPARISON:  None.  FINDINGS: There is no fracture or dislocation. There is ossification of the distal tibial fibular syndesmosis. There is a tiny osteophyte at the tip of the medial malleolus. No appreciable joint effusion.  IMPRESSION: No acute abnormality.   Electronically Signed   By: Geanie Cooley M.D.   On: 09/07/2014 13:01     EKG Interpretation None      MDM   Final diagnoses:  Right ankle sprain, initial encounter    BP 133/81 mmHg  Pulse 78  Temp(Src) 97.5 F (36.4 C) (Oral)  Resp 16  SpO2 100%  I have reviewed nursing notes and vital signs. I personally reviewed the imaging tests through PACS system  I reviewed available ER/hospitalization records thought the EMR   I personally performed the services described in this documentation, which was scribed in my presence. The recorded information has been reviewed and is accurate.  Fayrene Helper, PA-C 09/07/14 1313  Linwood Dibbles, MD 09/08/14 [phone]

## 2014-09-07 NOTE — ED Notes (Signed)
Pt using crutches with stable movement. aso applied.

## 2014-09-07 NOTE — ED Notes (Signed)
Ortho called and will place ASO

## 2014-09-07 NOTE — ED Notes (Signed)
Pt fell and slipped on ice, rt ankle pain, no loc, no back or neck pain. Good pulses, pink in color.

## 2016-06-16 ENCOUNTER — Emergency Department (HOSPITAL_COMMUNITY)
Admission: EM | Admit: 2016-06-16 | Discharge: 2016-06-16 | Disposition: A | Discharge status: Home / Self Care | Payer: Medicaid Other | Attending: Emergency Medicine | Admitting: Emergency Medicine

## 2016-06-16 ENCOUNTER — Encounter (HOSPITAL_COMMUNITY): Payer: Self-pay | Admitting: Emergency Medicine

## 2016-06-16 DIAGNOSIS — M25562 Pain in left knee: Secondary | ICD-10-CM | POA: Diagnosis not present

## 2016-06-16 DIAGNOSIS — M545 Low back pain, unspecified: Secondary | ICD-10-CM

## 2016-06-16 DIAGNOSIS — Z7982 Long term (current) use of aspirin: Secondary | ICD-10-CM | POA: Diagnosis not present

## 2016-06-16 DIAGNOSIS — G8929 Other chronic pain: Secondary | ICD-10-CM | POA: Insufficient documentation

## 2016-06-16 MED ORDER — CYCLOBENZAPRINE HCL 10 MG PO TABS: 10.0000 mg | ORAL_TABLET | Freq: Two times a day (BID) | ORAL | 0 refills | Status: DC | PRN

## 2016-06-16 NOTE — ED Provider Notes (Signed)
[facility]-EMERGENCY DEPT Provider Note   By signing my name below, I, Ian Wolf, attest that this documentation has been prepared under the direction and in the presence of [name], [place]PA-C. Electronically Signed: Earmon PhoenixJennifer Wolf, ED Scribe. 06/16/16. 2:41 PM.   History   Chief Complaint Chief Complaint  Patient presents with  . Back Pain  . Knee Pain    The history is provided by the patient and medical records. No language interpreter was used.    HPI Comments:  Ian Wolf is a 55 y.o. male with PMHx of chronic knee and back pain who presents to the Emergency Department complaining of left knee pain that began 2-3 days ago. He reports associated low back pain as well due to favoring the left knee when walking. He states he has had similar back pain in the past but this is worse. He has taken extra strength Tylenol with no significant relief of the pain. He reports using warm compresses on his left knee with moderate relief. Walking increases the pain. He denies alleviating factors. He denies bowel or bladder incontinence, numbness, tingling or weakness of the lower extremities, fever, chills, bruising or wounds. He denies trauma, injury or fall. He is ambulatory without assistance. He does not have a PCP.   Past Medical History:  Diagnosis Date  . Chronic back pain   . Chronic back pain   . Chronic knee pain   . Cocaine addiction (HCC)   . Post traumatic stress disorder (PTSD)   . Tuberculosis 1995    Patient Active Problem List   Diagnosis Date Noted  . Cocaine abuse 03/18/2012  . URTI (acute upper respiratory infection) 03/17/2012  . Chest pain 03/16/2012  . Cough 03/16/2012  . History of TB (tuberculosis) 03/16/2012  . KNEE PAIN, RIGHT 12/22/2009  . FRACTURE, PATELLA 12/22/2009    Past Surgical History:  Procedure Laterality Date  . KNEE SURGERY     L     Home Medications    Prior to Admission medications   Medication Sig Start Date End Date Taking?  Authorizing Provider  acetaminophen (TYLENOL) 500 MG tablet Take 500 mg by mouth every 6 (six) hours as needed for mild pain.    Historical Provider, MD  Aspirin-Acetaminophen (GOODY BODY PAIN) 500-325 MG PACK Take 1 Package by mouth daily as needed (pain).    Historical Provider, MD  HYDROcodone-acetaminophen (NORCO/VICODIN) 5-325 MG per tablet Take 1 tablet by mouth every 6 (six) hours as needed for moderate pain or severe pain. 05/18/14   Maris BergerJonah Gunalda, MD  ibuprofen (ADVIL,MOTRIN) 800 MG tablet Take 1 tablet (800 mg total) by mouth 3 (three) times daily. 09/07/14   Fayrene HelperBowie Tran, PA-C  Polyvinyl Alcohol-Povidone (REFRESH OP) Place 1 drop into both eyes daily as needed (irritation/dry eyes).    Historical Provider, MD    Family History History reviewed. No pertinent family history.  Social History Social History  Substance Use Topics  . Smoking status: Never Smoker  . Smokeless tobacco: Not on file  . Alcohol use 0.6 oz/week    1 Cans of beer per week     Allergies   Review of patient's allergies indicates no known allergies.   Review of Systems Review of Systems  Constitutional: Negative for chills and fever.  Genitourinary:       No bowel or bladder incontinence  Musculoskeletal: Positive for arthralgias, back pain and myalgias.  Skin: Negative for color change and wound.  Neurological: Negative for weakness and numbness.  Physical Exam Updated Vital Signs BP 130/91 (BP Location: Right Arm)   Pulse 84   Temp 97.7 F (36.5 C) (Oral)   Resp 18   SpO2 100%   Physical Exam Physical Exam  Constitutional: Pt appears well-developed and well-nourished. No distress.  HENT:  Head: Normocephalic and atraumatic.  Mouth/Throat: Oropharynx is clear and moist. No oropharyngeal exudate.  Eyes: Conjunctivae are normal.  Neck: Normal range of motion. Neck supple.  No meningismus Cardiovascular: Normal rate, regular rhythm and intact distal pulses.   Pulmonary/Chest: Effort  normal and breath sounds normal. No respiratory distress. Pt has no wheezes.  Abdominal: Pt exhibits no distension Musculoskeletal:  Left lumbar musculature tender to palpation, no bony CTLS spine tenderness, deformity, step-off, or crepitus Left knee non-tender to palpation, no bony abnormality or deformity Lymphadenopathy: Pt has no cervical adenopathy.  Neurological: Pt is alert and oriented Speech is clear and goal oriented, follows commands Normal 5/5 strength in upper and lower extremities bilaterally including dorsiflexion and plantar flexion, strong and equal grip strength Sensation intact Great toe extension intact Moves extremities without ataxia, coordination intact Normal gait Normal balance No Clonus Skin: Skin is warm and dry. No rash noted. Pt is not diaphoretic. No erythema.  Psychiatric: Pt has a normal mood and affect. Behavior is normal.  Nursing note and vitals reviewed.   ED Treatments / Results  DIAGNOSTIC STUDIES: Oxygen Saturation is 100% on RA, normal by my interpretation.   COORDINATION OF CARE: 2:38 PM- Will order knee sleeve for left knee and prescribe muscle relaxer. Pt verbalizes understanding and agrees to plan.  Medications - No data to display  Labs (all labs ordered are listed, but only abnormal results are displayed) Labs Reviewed - No data to display  EKG  EKG Interpretation None       Radiology No results found.  Procedures Procedures (including critical care time)  Medications Ordered in ED Medications - No data to display   Initial Impression / Assessment and Plan / ED Course  I have reviewed the triage vital signs and the nursing notes.  Pertinent labs & imaging results that were available during my care of the patient were reviewed by me and considered in my medical decision making (see chart for details).  Clinical Course    Patient with chronic knee and back pain.  Recommend ortho and PCP follow-up.  I personally  performed the services described in this documentation, which was scribed in my presence. The recorded information has been reviewed and is accurate.     Final Clinical Impressions(s) / ED Diagnoses   Final diagnoses:  Chronic left-sided low back pain without sciatica  Acute pain of left knee    New Prescriptions Discharge Medication List as of 06/16/2016  2:45 PM    START taking these medications   Details  cyclobenzaprine (FLEXERIL) 10 MG tablet Take 1 tablet (10 mg total) by mouth 2 (two) times daily as needed for muscle spasms., Starting Fri 06/16/2016, Print         Roxy Horsemanobert Aronda Burford, PA-C 06/16/16 1459    Tilden FossaElizabeth Rees, MD 06/18/16 [phone]

## 2016-06-16 NOTE — ED Notes (Signed)
Coupon given to patient.

## 2016-06-16 NOTE — ED Triage Notes (Signed)
Pt sts chronic pain in lower back and knee x several days

## 2016-10-16 ENCOUNTER — Emergency Department (HOSPITAL_COMMUNITY)
Admission: EM | Admit: 2016-10-16 | Discharge: 2016-10-16 | Disposition: A | Discharge status: Home / Self Care | Payer: Medicaid Other | Attending: Dermatology | Admitting: Dermatology

## 2016-10-16 ENCOUNTER — Encounter (HOSPITAL_COMMUNITY): Payer: Self-pay | Admitting: Emergency Medicine

## 2016-10-16 DIAGNOSIS — F10230 Alcohol dependence with withdrawal, uncomplicated: Secondary | ICD-10-CM | POA: Insufficient documentation

## 2016-10-16 DIAGNOSIS — Z5321 Procedure and treatment not carried out due to patient leaving prior to being seen by health care provider: Secondary | ICD-10-CM | POA: Diagnosis not present

## 2016-10-16 NOTE — ED Notes (Signed)
Bed: [facility] Expected date:  Expected time:  Means of arrival:  Comments: 

## 2016-10-16 NOTE — ED Notes (Signed)
No answer from [facility] upon call.

## 2016-10-16 NOTE — ED Triage Notes (Signed)
Pt was in detox program and relapsed on ETOH and crack.  Last use was yesterday of both. Patient requesting detox.

## 2016-10-21 ENCOUNTER — Emergency Department (HOSPITAL_COMMUNITY)
Admission: EM | Admit: 2016-10-21 | Discharge: 2016-10-21 | Disposition: A | Discharge status: Home / Self Care | Payer: Medicaid Other | Attending: Emergency Medicine | Admitting: Emergency Medicine

## 2016-10-21 ENCOUNTER — Emergency Department (HOSPITAL_COMMUNITY): Payer: Medicaid Other

## 2016-10-21 ENCOUNTER — Encounter (HOSPITAL_COMMUNITY): Payer: Self-pay | Admitting: Emergency Medicine

## 2016-10-21 DIAGNOSIS — Y9301 Activity, walking, marching and hiking: Secondary | ICD-10-CM | POA: Diagnosis not present

## 2016-10-21 DIAGNOSIS — Y999 Unspecified external cause status: Secondary | ICD-10-CM | POA: Insufficient documentation

## 2016-10-21 DIAGNOSIS — Y929 Unspecified place or not applicable: Secondary | ICD-10-CM | POA: Insufficient documentation

## 2016-10-21 DIAGNOSIS — Z79899 Other long term (current) drug therapy: Secondary | ICD-10-CM | POA: Diagnosis not present

## 2016-10-21 DIAGNOSIS — M25561 Pain in right knee: Secondary | ICD-10-CM | POA: Insufficient documentation

## 2016-10-21 DIAGNOSIS — Z7982 Long term (current) use of aspirin: Secondary | ICD-10-CM | POA: Insufficient documentation

## 2016-10-21 DIAGNOSIS — X501XXA Overexertion from prolonged static or awkward postures, initial encounter: Secondary | ICD-10-CM | POA: Diagnosis not present

## 2016-10-21 MED ORDER — NAPROXEN 250 MG PO TABS
500.0000 mg | ORAL_TABLET | Freq: Once | ORAL | Status: AC
  Administered 2016-10-21: 500 mg via ORAL
  Filled 2016-10-21: qty 2

## 2016-10-21 MED ORDER — NAPROXEN 500 MG PO TABS: 500.0000 mg | ORAL_TABLET | Freq: Two times a day (BID) | ORAL | 0 refills | Status: DC

## 2016-10-21 NOTE — ED Triage Notes (Addendum)
Pt brought in by [facility]. Reported by EMS that [facility] [employer] and [place] gave out on him.   Pt states he was walking and he knee buckled on him. Pt states he has chronic knee problems and got over confident while walking. Pt states he has medications that take away his pain but when they wear off the pain returns.

## 2016-10-21 NOTE — ED Provider Notes (Signed)
[facility] DEPT Provider Note   CSN: [id number] Arrival date & time: 10/21/16  0353     History   Chief Complaint Chief Complaint  Patient presents with  . Knee Pain    HPI Ian Wolf is a 56 y.o. male.  Patient with a history of chronic knee pain bilaterally presents with increased right knee pain since fall yesterday. He states the knee gave out on him and he fell onto the knee causing more pain that his usual. No other injury.   The history is provided by the patient. No language interpreter was used.  Knee Pain   Pertinent negatives include no numbness.    Past Medical History:  Diagnosis Date  . Chronic back pain   . Chronic back pain   . Chronic knee pain   . Cocaine addiction (HCC)   . Post traumatic stress disorder (PTSD)   . Tuberculosis 1995    Patient Active Problem List   Diagnosis Date Noted  . Cocaine abuse 03/18/2012  . URTI (acute upper respiratory infection) 03/17/2012  . Chest pain 03/16/2012  . Cough 03/16/2012  . History of TB (tuberculosis) 03/16/2012  . KNEE PAIN, RIGHT 12/22/2009  . FRACTURE, PATELLA 12/22/2009    Past Surgical History:  Procedure Laterality Date  . KNEE SURGERY     L       Home Medications    Prior to Admission medications   Medication Sig Start Date End Date Taking? Authorizing Provider  acetaminophen (TYLENOL) 500 MG tablet Take 500 mg by mouth every 6 (six) hours as needed for mild pain.    Historical Provider, MD  Aspirin-Acetaminophen (GOODY BODY PAIN) 500-325 MG PACK Take 1 Package by mouth daily as needed (pain).    Historical Provider, MD  cyclobenzaprine (FLEXERIL) 10 MG tablet Take 1 tablet (10 mg total) by mouth 2 (two) times daily as needed for muscle spasms. 06/16/16   Roxy Horseman, PA-C  HYDROcodone-acetaminophen (NORCO/VICODIN) 5-325 MG per tablet Take 1 tablet by mouth every 6 (six) hours as needed for moderate pain or severe pain. 05/18/14   Maris Berger, MD  ibuprofen (ADVIL,MOTRIN)  800 MG tablet Take 1 tablet (800 mg total) by mouth 3 (three) times daily. 09/07/14   Fayrene Helper, PA-C  Polyvinyl Alcohol-Povidone (REFRESH OP) Place 1 drop into both eyes daily as needed (irritation/dry eyes).    Historical Provider, MD    Family History No family history on file.  Social History Social History  Substance Use Topics  . Smoking status: Never Smoker  . Smokeless tobacco: Never Used  . Alcohol use 0.6 oz/week    1 Cans of beer per week     Allergies   Patient has no known allergies.   Review of Systems Review of Systems  Constitutional: Negative for chills and fever.  Musculoskeletal: Positive for arthralgias.       See HPI.  Skin: Negative.  Negative for wound.  Neurological: Negative.  Negative for weakness and numbness.     Physical Exam Updated Vital Signs BP 121/87 (BP Location: Right Arm)   Pulse 81   Temp 98.3 F (36.8 C) (Oral)   Resp 20   Ht 6' (1.829 m)   Wt 104.3 kg   SpO2 98%   BMI 31.19 kg/m   Physical Exam  Constitutional: He is oriented to person, place, and time. He appears well-developed and well-nourished.  Neck: Normal range of motion.  Cardiovascular: Intact distal pulses.   Pulmonary/Chest: Effort normal.  Musculoskeletal: Normal  range of motion.  Right knee has chronic deformity of patellar displacement. No acute redness or swelling. No calf or this tenderness. FROM all joints of right LE.  Neurological: He is alert and oriented to person, place, and time.  Skin: Skin is warm and dry.  Psychiatric: He has a normal mood and affect.     ED Treatments / Results  Labs (all labs ordered are listed, but only abnormal results are displayed) Labs Reviewed - No data to display  EKG  EKG Interpretation None       Radiology No results found. Dg Knee Complete 4 Views Right  Result Date: 10/21/2016 CLINICAL DATA:  Right knee pain after falling tonight EXAM: RIGHT KNEE - COMPLETE 4+ VIEW COMPARISON:  04/05/2012 FINDINGS:  Remote healed proximal fibular diaphyseal fracture deformity. Chronic patellar tendon injury with abnormal proximal position of the patella. No acute fracture. No acute soft tissue abnormality. IMPRESSION: Chronic patella tendon injury.  No acute fracture. Electronically Signed   By: Ellery Plunkaniel R Mitchell M.D.   On: 10/21/2016 05:33    Procedures Procedures (including critical care time)  Medications Ordered in ED Medications - No data to display   Initial Impression / Assessment and Plan / ED Course  I have reviewed the triage vital signs and the nursing notes.  Pertinent labs & imaging results that were available during my care of the patient were reviewed by me and considered in my medical decision making (see chart for details).     Patient with chronic right knee pain presents with worse pain after fall. Imaging is negative. Knee immobilizer provided.  Final Clinical Impressions(s) / ED Diagnoses   Final diagnoses:  None   1. Right knee pain  New Prescriptions New Prescriptions   No medications on file     Danne HarborShari Lamyia Cdebaca, PA-C 10/23/16 [phone]    April Palumbo, MD 10/23/16 2321

## 2016-10-21 NOTE — ED Notes (Signed)
Pt states he was walking and he knee buckled on him. Pt states he has chronic knee problems and got over confident while walking. Pt states he has medications that take away his pain but when they wear off the pain returns.

## 2017-01-08 ENCOUNTER — Encounter (HOSPITAL_COMMUNITY): Payer: Self-pay | Admitting: Emergency Medicine

## 2017-01-08 ENCOUNTER — Emergency Department (HOSPITAL_COMMUNITY)
Admission: EM | Admit: 2017-01-08 | Discharge: 2017-01-08 | Disposition: A | Discharge status: Home / Self Care | Payer: Medicaid Other | Attending: Emergency Medicine | Admitting: Emergency Medicine

## 2017-01-08 DIAGNOSIS — Z7982 Long term (current) use of aspirin: Secondary | ICD-10-CM | POA: Insufficient documentation

## 2017-01-08 DIAGNOSIS — R319 Hematuria, unspecified: Secondary | ICD-10-CM

## 2017-01-08 DIAGNOSIS — Z79899 Other long term (current) drug therapy: Secondary | ICD-10-CM | POA: Diagnosis not present

## 2017-01-08 LAB — CBC
HCT: 39.6 % (ref 39.0–52.0)
Hemoglobin: 13.4 g/dL (ref 13.0–17.0)
MCH: 30.9 pg (ref 26.0–34.0)
MCHC: 33.8 g/dL (ref 30.0–36.0)
MCV: 91.2 fL (ref 78.0–100.0)
PLATELETS: 247 10*3/uL (ref 150–400)
RBC: 4.34 MIL/uL (ref 4.22–5.81)
RDW: 14 % (ref 11.5–15.5)
WBC: 5 10*3/uL (ref 4.0–10.5)

## 2017-01-08 LAB — URINALYSIS, ROUTINE W REFLEX MICROSCOPIC
Bilirubin Urine: NEGATIVE
Glucose, UA: 50 mg/dL — AB
Ketones, ur: 20 mg/dL — AB
Leukocytes, UA: NEGATIVE
NITRITE: POSITIVE — AB
PH: 6 (ref 5.0–8.0)
Protein, ur: 100 mg/dL — AB
SPECIFIC GRAVITY, URINE: 1.014 (ref 1.005–1.030)

## 2017-01-08 LAB — BASIC METABOLIC PANEL
Anion gap: 10 (ref 5–15)
BUN: 9 mg/dL (ref 6–20)
CHLORIDE: 106 mmol/L (ref 101–111)
CO2: 22 mmol/L (ref 22–32)
CREATININE: 0.94 mg/dL (ref 0.61–1.24)
Calcium: 9.4 mg/dL (ref 8.9–10.3)
GFR calc Af Amer: >60 mL/min (ref >60)
GFR calc non Af Amer: >60 mL/min (ref >60)
Glucose, Bld: 96 mg/dL (ref 65–99)
Potassium: 4 mmol/L (ref 3.5–5.1)
Sodium: 138 mmol/L (ref 135–145)

## 2017-01-08 NOTE — ED Triage Notes (Signed)
Per EMS, patient from home complaining of blood in urine and after intercourse. C/o of painful urination.  No hx of stones or STIs.  No blood thinners.  Afebrile.  Hx of chronic knee pain.  A/O x 4.  No IV.  VSS.

## 2017-01-08 NOTE — ED Notes (Signed)
Pt complaining of blood in urine and burning when urinating.  Afebrile. Denies N/V/D.

## 2017-01-08 NOTE — ED Notes (Signed)
Pt unable to make urine prior to be dc as expected and he is refusing to have a foley placed on the ED. Pt oriented that if he is not able to urinate the foley will prevent him for getting more sick and be on a lot of pain. Pt verbalize understanding and states he still refuse, Dr. Dessa Phiampus notified and pt dc home.

## 2017-01-08 NOTE — ED Notes (Signed)
Patient aware urine specimen needed but unable to urinate at this time.

## 2017-01-08 NOTE — ED Provider Notes (Signed)
[facility] Provider Note   CSN: [id number] Arrival date & time: 01/08/17  1705     History   Chief Complaint Chief Complaint  Patient presents with  . Hematuria    HPI Ian Wolf is a 56 y.o. male.  Patient reports that for the last 7 years, he's been having intermittent hematuria. Getting progressively worse over the last few months. Now notices every time he urinates, as well as when he ejaculates. Denies any abdominal pain. Denies any pain in his penis or his testicles. He had this again and his sexual partner told him to come here for further evaluation. Denies a history of STDs. Denies fevers or chills. Denies history of alcohol or cigarette use. Patient reports that prior to arrival here, he was attempting to urinate and having urinary hesitancy.   The history is provided by the patient.  Illness  This is a recurrent problem. Episode onset: 7 years. The problem occurs constantly. The problem has been gradually worsening. Pertinent negatives include no abdominal pain. He has tried nothing for the symptoms.    Past Medical History:  Diagnosis Date  . Chronic back pain   . Chronic back pain   . Chronic knee pain   . Cocaine addiction (HCC)   . Post traumatic stress disorder (PTSD)   . Tuberculosis 1995    Patient Active Problem List   Diagnosis Date Noted  . Cocaine abuse 03/18/2012  . URTI (acute upper respiratory infection) 03/17/2012  . Chest pain 03/16/2012  . Cough 03/16/2012  . History of TB (tuberculosis) 03/16/2012  . KNEE PAIN, RIGHT 12/22/2009  . FRACTURE, PATELLA 12/22/2009    Past Surgical History:  Procedure Laterality Date  . KNEE SURGERY     L       Home Medications    Prior to Admission medications   Medication Sig Start Date End Date Taking? Authorizing Provider  acetaminophen (TYLENOL) 500 MG tablet Take 500 mg by mouth every 6 (six) hours as needed for mild pain.   Yes [provider]  Aspirin-Acetaminophen  (GOODY BODY PAIN) 500-325 MG PACK Take 1 Package by mouth daily as needed (pain).   Yes [provider]  ibuprofen (ADVIL,MOTRIN) 800 MG tablet Take 1 tablet (800 mg total) by mouth 3 (three) times daily. 09/07/14  Yes Fayrene Helper, PA-C  Polyvinyl Alcohol-Povidone (REFRESH OP) Place 1 drop into both eyes daily as needed (irritation/dry eyes).   Yes [provider]  cyclobenzaprine (FLEXERIL) 10 MG tablet Take 1 tablet (10 mg total) by mouth 2 (two) times daily as needed for muscle spasms. Patient not taking: Reported on 01/08/2017 06/16/16   Roxy Horseman, PA-C  HYDROcodone-acetaminophen (NORCO/VICODIN) 5-325 MG per tablet Take 1 tablet by mouth every 6 (six) hours as needed for moderate pain or severe pain. Patient not taking: Reported on 01/08/2017 05/18/14   Maris Berger, MD  naproxen (NAPROSYN) 500 MG tablet Take 1 tablet (500 mg total) by mouth 2 (two) times daily. Patient not taking: Reported on 01/08/2017 10/21/16   Elpidio Anis, PA-C    Family History History reviewed. No pertinent family history.  Social History Social History  Substance Use Topics  . Smoking status: Never Smoker  . Smokeless tobacco: Never Used  . Alcohol use 0.6 oz/week    1 Cans of beer per week     Allergies   Patient has no known allergies.   Review of Systems Review of Systems  Constitutional: Negative for chills and fever.  Gastrointestinal: Negative  for abdominal pain, diarrhea, nausea and vomiting.  Genitourinary: Positive for difficulty urinating and hematuria. Negative for penile pain and testicular pain.     Physical Exam Updated Vital Signs BP (!) 137/95   Pulse 79   Temp 99 F (37.2 C) (Oral)   Resp 18   Ht 6' (1.829 m)   Wt 102.1 kg (225 lb)   SpO2 99%   BMI 30.52 kg/m   Physical Exam  Constitutional: He appears well-developed and well-nourished.  HENT:  Head: Normocephalic and atraumatic.  Eyes: Conjunctivae are normal.  Neck: Neck supple.    Cardiovascular: Normal rate and regular rhythm.   No murmur heard. Pulmonary/Chest: Effort normal and breath sounds normal. No respiratory distress.  Abdominal: Soft. There is no tenderness. There is no rebound, no guarding, no tenderness at McBurney's point and negative Murphy's sign.  Genitourinary: Testes normal and penis normal. Cremasteric reflex is present. Right testis shows no tenderness. Left testis shows no tenderness. Circumcised. No discharge found.  Musculoskeletal: He exhibits no edema.  Neurological: He is alert.  Skin: Skin is warm and dry.  Psychiatric: He has a normal mood and affect.  Nursing note and vitals reviewed.    ED Treatments / Results  Labs (all labs ordered are listed, but only abnormal results are displayed) Labs Reviewed  URINALYSIS, ROUTINE W REFLEX MICROSCOPIC - Abnormal; Notable for the following:       Result Value   Color, Urine RED (*)    APPearance TURBID (*)    Glucose, UA 50 (*)    Hgb urine dipstick LARGE (*)    Ketones, ur 20 (*)    Protein, ur 100 (*)    Nitrite POSITIVE (*)    Bacteria, UA FEW (*)    Squamous Epithelial / LPF 0-5 (*)    All other components within normal limits  BASIC METABOLIC PANEL  CBC    EKG  EKG Interpretation None       Radiology No results found.  Procedures Procedures (including critical care time)  Medications Ordered in ED Medications - No data to display   Initial Impression / Assessment and Plan / ED Course  I have reviewed the triage vital signs and the nursing notes.  Pertinent labs & imaging results that were available during my care of the patient were reviewed by me and considered in my medical decision making (see chart for details).     Given the description of symptoms of these being present for the last 7 years, getting progressively worse, concerned that the patient could have malignancy. Referred the patient to outpatient urology. Labs here reassuring. Hemoglobin within  normal limits. Normal kidney function. Urinalysis shows large blood. Negative leuks. Doubt infectious process. Will not start him on any antibiotics for now. The main concern for the patient for now is that he is having trouble urinating. Gave him multiple cups of water, the patient continues to be unable to urinate. Had nursing place a Foley at bedside. We'll leave that in place. We'll have him reevaluate with urology.  Final diagnoses:  Hematuria, unspecified type    New Prescriptions Discharge Medication List as of 01/08/2017  7:53 PM       Lindalou Hose'Rourke, Andreya Lacks, MD 01/08/17 2350    Azalia Bilisampos, Kevin, MD 01/09/17 2328

## 2020-08-02 ENCOUNTER — Ambulatory Visit: Payer: Self-pay

## 2020-08-02 ENCOUNTER — Ambulatory Visit: Payer: Medicaid Other | Attending: Critical Care Medicine

## 2020-08-02 NOTE — Progress Notes (Signed)
   Covid-19 Vaccination Clinic  Name:  Ian Wolf    MRN: [id number] DOB: 09-Aug-1961  08/02/2020  [name] was observed post Covid-19 immunization for 15 minutes without incident. He was provided with Vaccine Information Sheet and instruction to access the V-Safe system.   [name] was instructed to call 911 with any severe reactions post vaccine: Marland Kitchen Difficulty breathing  . Swelling of face and throat  . A fast heartbeat  . A bad rash all over body  . Dizziness and weakness

## 2021-07-30 ENCOUNTER — Emergency Department (HOSPITAL_COMMUNITY): Payer: Medicaid Other

## 2021-07-30 ENCOUNTER — Other Ambulatory Visit: Payer: Self-pay

## 2021-07-30 ENCOUNTER — Encounter (HOSPITAL_COMMUNITY): Payer: Self-pay

## 2021-07-30 ENCOUNTER — Observation Stay (HOSPITAL_COMMUNITY)
Admission: EM | Admit: 2021-07-30 | Discharge: 2021-08-01 | Disposition: A | Discharge status: Home / Self Care | Payer: Medicaid Other | Attending: Student in an Organized Health Care Education/Training Program | Admitting: Student in an Organized Health Care Education/Training Program

## 2021-07-30 ENCOUNTER — Observation Stay (HOSPITAL_BASED_OUTPATIENT_CLINIC_OR_DEPARTMENT_OTHER): Payer: Medicaid Other

## 2021-07-30 DIAGNOSIS — R0789 Other chest pain: Secondary | ICD-10-CM | POA: Diagnosis present

## 2021-07-30 DIAGNOSIS — Z20822 Contact with and (suspected) exposure to covid-19: Secondary | ICD-10-CM | POA: Insufficient documentation

## 2021-07-30 DIAGNOSIS — I2699 Other pulmonary embolism without acute cor pulmonale: Secondary | ICD-10-CM | POA: Diagnosis not present

## 2021-07-30 DIAGNOSIS — I82403 Acute embolism and thrombosis of unspecified deep veins of lower extremity, bilateral: Secondary | ICD-10-CM | POA: Diagnosis not present

## 2021-07-30 DIAGNOSIS — M25569 Pain in unspecified knee: Secondary | ICD-10-CM

## 2021-07-30 DIAGNOSIS — Z7901 Long term (current) use of anticoagulants: Secondary | ICD-10-CM | POA: Diagnosis not present

## 2021-07-30 DIAGNOSIS — R079 Chest pain, unspecified: Secondary | ICD-10-CM

## 2021-07-30 DIAGNOSIS — R911 Solitary pulmonary nodule: Secondary | ICD-10-CM

## 2021-07-30 LAB — RAPID URINE DRUG SCREEN, HOSP PERFORMED
Amphetamines: NOT DETECTED
Barbiturates: NOT DETECTED
Benzodiazepines: NOT DETECTED
Cocaine: POSITIVE — AB
Opiates: NOT DETECTED
Tetrahydrocannabinol: NOT DETECTED

## 2021-07-30 LAB — CBC
HCT: 40.9 % (ref 39.0–52.0)
Hemoglobin: 13.4 g/dL (ref 13.0–17.0)
MCH: 29.3 pg (ref 26.0–34.0)
MCHC: 32.8 g/dL (ref 30.0–36.0)
MCV: 89.3 fL (ref 80.0–100.0)
Platelets: 309 10*3/uL (ref 150–400)
RBC: 4.58 MIL/uL (ref 4.22–5.81)
RDW: 14.9 % (ref 11.5–15.5)
WBC: 8.3 10*3/uL (ref 4.0–10.5)
nRBC: 0 % (ref 0.0–0.2)

## 2021-07-30 LAB — BASIC METABOLIC PANEL
Anion gap: 11 (ref 5–15)
BUN: 6 mg/dL — ABNORMAL LOW (ref 8–23)
CO2: 18 mmol/L — ABNORMAL LOW (ref 22–32)
Calcium: 9.2 mg/dL (ref 8.9–10.3)
Chloride: 108 mmol/L (ref 98–111)
Creatinine, Ser: 0.85 mg/dL (ref 0.61–1.24)
GFR, Estimated: >60 mL/min (ref >60)
Glucose, Bld: 94 mg/dL (ref 70–99)
Potassium: 3.8 mmol/L (ref 3.5–5.1)
Sodium: 137 mmol/L (ref 135–145)

## 2021-07-30 LAB — HIV ANTIBODY (ROUTINE TESTING W REFLEX): HIV Screen 4th Generation wRfx: NONREACTIVE

## 2021-07-30 LAB — RESP PANEL BY RT-PCR (FLU A&B, COVID) ARPGX2
Influenza A by PCR: NEGATIVE
Influenza B by PCR: NEGATIVE
SARS Coronavirus 2 by RT PCR: NEGATIVE

## 2021-07-30 LAB — TROPONIN I (HIGH SENSITIVITY)
Troponin I (High Sensitivity): 5 ng/L (ref <18)
Troponin I (High Sensitivity): 5 ng/L (ref <18)

## 2021-07-30 LAB — LIPASE, BLOOD: Lipase: 24 U/L (ref 11–51)

## 2021-07-30 MED ORDER — ADULT MULTIVITAMIN W/MINERALS CH
1.0000 | ORAL_TABLET | Freq: Every day | ORAL | Status: DC
  Administered 2021-07-30 – 2021-08-01 (×3): 1 via ORAL
  Filled 2021-07-30 (×3): qty 1

## 2021-07-30 MED ORDER — THIAMINE HCL 100 MG PO TABS
100.0000 mg | ORAL_TABLET | Freq: Every day | ORAL | Status: DC
  Administered 2021-07-30 – 2021-08-01 (×3): 100 mg via ORAL
  Filled 2021-07-30 (×3): qty 1

## 2021-07-30 MED ORDER — FOLIC ACID 1 MG PO TABS
1.0000 mg | ORAL_TABLET | Freq: Every day | ORAL | Status: DC
  Administered 2021-07-30 – 2021-08-01 (×3): 1 mg via ORAL
  Filled 2021-07-30 (×3): qty 1

## 2021-07-30 MED ORDER — ACETAMINOPHEN 325 MG PO TABS
650.0000 mg | ORAL_TABLET | Freq: Four times a day (QID) | ORAL | Status: DC | PRN
  Administered 2021-07-30 – 2021-08-01 (×4): 650 mg via ORAL
  Filled 2021-07-30 (×3): qty 2

## 2021-07-30 MED ORDER — IOHEXOL 350 MG/ML SOLN
80.0000 mL | Freq: Once | INTRAVENOUS | Status: AC | PRN
  Administered 2021-07-30: 80 mL via INTRAVENOUS

## 2021-07-30 MED ORDER — RIVAROXABAN 15 MG PO TABS
15.0000 mg | ORAL_TABLET | Freq: Two times a day (BID) | ORAL | Status: DC
  Administered 2021-07-30 – 2021-08-01 (×4): 15 mg via ORAL
  Filled 2021-07-30 (×6): qty 1

## 2021-07-30 MED ORDER — RIVAROXABAN 20 MG PO TABS: 20.0000 mg | ORAL_TABLET | Freq: Every day | ORAL | Status: DC

## 2021-07-30 MED ORDER — ACETAMINOPHEN 650 MG RE SUPP: 650.0000 mg | Freq: Four times a day (QID) | RECTAL | Status: DC | PRN

## 2021-07-30 MED ORDER — NITROGLYCERIN 0.4 MG SL SUBL
0.4000 mg | SUBLINGUAL_TABLET | SUBLINGUAL | Status: DC | PRN
  Administered 2021-07-30: 0.4 mg via SUBLINGUAL
  Filled 2021-07-30 (×2): qty 1

## 2021-07-30 MED ORDER — THIAMINE HCL 100 MG/ML IJ SOLN: 100.0000 mg | Freq: Every day | INTRAMUSCULAR | Status: DC

## 2021-07-30 NOTE — ED Notes (Signed)
Pt aware of needed void urinal at bedside

## 2021-07-30 NOTE — ED Notes (Signed)
Pt still unable to void urinal at bedside. Pt is aware of needed void

## 2021-07-30 NOTE — H&P (Addendum)
Date: 07/30/2021               Patient Name:  Ian Wolf MRN: [id number]  DOB: 1961-01-09 Age / Sex: 60 y.o., male   PCP: [facility]         Medical Service: Internal Medicine Teaching Service         Attending Physician: Dr. Margarita Grizzle, MD    First Contact: Ilene Qua, MD Pager: [phone]  Second Contact: Quincy Simmonds, MD Pager: Eustaquio Maize [phone]       After Hours (After 5p/  First Contact Pager: [phone]  weekends / holidays): Second Contact Pager: [phone]   SUBJECTIVE   Chief Complaint: Chest Pain  History of Present Illness:  Ian Wolf is a 60 y.o. M with a PMH of cocaine use disorder who is coming in with sudden onset chest pain. It stared at about 2 in the morning when he was in front of the [facility] building. He said it was on the left side of his chest and hurt more when he took a deep breath in. He also felt some palpitations at that time. He did not lose consciousness. He says this was similar to an episode he had 4 to 5 years ago. At that time he had to stay in the hospital a couple days but does not remember what caused the episode or if they put him on any medications as he was going through something at the time and doesn't really remember what all that happened.   The pain is still there right now and is worse when he is inhaling or exhaling. He denies any recent surgeries, immobilization, cancer, or changes in his health. Leading up to this he had been otherwise healthy. He does not take any medications. No bleeding problems that he is aware of.  Meds:  None  Past Medical History:  Diagnosis Date   Chronic back pain    Chronic back pain    Chronic knee pain    Cocaine addiction (HCC)    Post traumatic stress disorder (PTSD)    Tuberculosis 1995  Was in rehab prescribed blood pressure medication, no longer takes this  Past Surgical History:  Procedure Laterality Date   KNEE SURGERY     L    Social:  Currently unhoused, staying in  [place] Occupation: disabled Level of Function: able to complete his ADLs PCP: PCP moved several years ago, has not followed with anyone since.  Substances: prior use of crack (last reported a few months ago), no tobacco, occasional alcohol use (1 beer a couple days a week)  Family History:  Reports a history of some sort of blood clots in his brother who takes blood thinners Father died of colon cancer 38  Allergies: Allergies as of 07/30/2021   (No Known Allergies)    Review of Systems: A complete ROS was negative except as per HPI.   OBJECTIVE:   Physical Exam: Blood pressure 139/73, pulse 93, temperature 97.8 F (36.6 C), resp. rate 19, SpO2 100 %.  Constitutional: Middle aged man resting comfortably in bed, in no acute distress Cardiovascular: regular rate and rhythm, no m/r/g; trace edema of the RLE, DP and PT pulses intact Pulmonary/Chest: normal work of breathing on room air, lungs clear to auscultation bilaterally Abdominal: soft, non-tender, non-distended MSK: normal bulk and tone Neurological: alert & oriented x 3, answering questions appropriately Skin: warm and dry Psych: normal affect  Labs: CBC    Component Value Date/Time  WBC 8.3 07/30/2021 0443   RBC 4.58 07/30/2021 0443   HGB 13.4 07/30/2021 0443   HCT 40.9 07/30/2021 0443   PLT 309 07/30/2021 0443   MCV 89.3 07/30/2021 0443   MCH 29.3 07/30/2021 0443   MCHC 32.8 07/30/2021 0443   RDW 14.9 07/30/2021 0443   LYMPHSABS 1.0 02/09/2014 2239   MONOABS 1.6 (H) 02/09/2014 2239   EOSABS 0.0 02/09/2014 2239   BASOSABS 0.0 02/09/2014 2239     CMP     Component Value Date/Time   NA 137 07/30/2021 0443   K 3.8 07/30/2021 0443   CL 108 07/30/2021 0443   CO2 18 (L) 07/30/2021 0443   GLUCOSE 94 07/30/2021 0443   BUN 6 (L) 07/30/2021 0443   CREATININE 0.85 07/30/2021 0443   CALCIUM 9.2 07/30/2021 0443   PROT 8.2 06/18/2014 0655   ALBUMIN 4.4 06/18/2014 0655   AST 20 06/18/2014 0655   ALT 26  06/18/2014 0655   ALKPHOS 115 06/18/2014 0655   BILITOT 0.4 06/18/2014 0655   GFRNONAA >60 07/30/2021 0443   GFRAA >60 01/08/2017 1730    Imaging: DG Chest 2 View  Result Date: 07/30/2021 CLINICAL DATA:  60 year old male with history of chest pain. EXAM: CHEST - 2 VIEW COMPARISON:  Chest x-ray 05/18/2014. FINDINGS: Lung volumes are normal. No consolidative airspace disease. No pleural effusions. No pneumothorax. 7 mm nodule projecting over the lateral aspect of the lower right lung, only confidently identified on the frontal projection. Pulmonary vasculature and the cardiomediastinal silhouette are within normal limits. IMPRESSION: 1. Small nodular density seen only on the frontal view projecting over the lower right lung. This may represent a prominent nipple shadow. Follow-up standing PA and lateral chest radiograph is recommended in 3-4 weeks to ensure the stability or regression of this finding. The follow-up study should be performed with nipple markers in place. 2. No radiographic evidence of acute cardiopulmonary disease. Electronically Signed   By: Trudie Reed M.D.   On: 07/30/2021 05:17   CT Angio Chest PE W and/or Wo Contrast  Result Date: 07/30/2021 CLINICAL DATA:  Chest pain or SOB, pleurisy or effusion suspected EXAM: CT ANGIOGRAPHY CHEST WITH CONTRAST TECHNIQUE: Multidetector CT imaging of the chest was performed using the standard protocol during bolus administration of intravenous contrast. Multiplanar CT image reconstructions and MIPs were obtained to evaluate the vascular anatomy. CONTRAST:  43mL OMNIPAQUE IOHEXOL 350 MG/ML SOLN COMPARISON:  Chest CT 03/11/2011 FINDINGS: Cardiovascular: There are bilateral pulmonary emboli, in the left lower lobar, segmental and subsegmental branches as well as the right medial basilar segmental branches. There is no CT evidence of right heart strain. The thoracic aorta is unremarkable. Normal cardiac size. No pericardial disease.  Mediastinum/Nodes: There are few prominent right hilar lymph nodes, likely reactive. Thyroid is unremarkable. The trachea is unremarkable. The esophagus is unremarkable. Lungs/Pleura: No focal airspace consolidation. Bibasilar hypoventilatory changes. No suspicious pulmonary nodules or masses. Left basilar scarring/linear atelectasis. The airways are patent. No pleural effusion. No pneumothorax. Upper Abdomen: No acute abnormality. Musculoskeletal: No acute osseous abnormality. No suspicious lytic or blastic lesions. Review of the MIP images confirms the above findings. IMPRESSION: Positive for pulmonary emboli in the left lower lobar, segmental, and subsegmental branches as well as the right medial basilar segmental branches. No CT evidence of right heart strain. Critical Value/emergent results were called by telephone at the time of interpretation on 07/30/2021 at 10:45 am to provider [facility] RAY , who verbally acknowledged these results. Electronically Signed   By:  Caprice Renshaw M.D.   On: 07/30/2021 10:50   DG Chest Port 1 View  Result Date: 07/30/2021 CLINICAL DATA:  Chest pain EXAM: PORTABLE CHEST 1 VIEW COMPARISON:  Radiograph 07/30/2021 FINDINGS: Unchanged cardiomediastinal silhouette. No new airspace disease. No pleural effusion. No pneumothorax. No acute osseous abnormality. The nodular density seen on recent chest radiograph performed earlier today is not visible and was likely a nipple shadow. IMPRESSION: No evidence of acute cardiopulmonary disease. Electronically Signed   By: Caprice Renshaw M.D.   On: 07/30/2021 09:04    EKG: personally reviewed my interpretation is sinus rhythm   ASSESSMENT & PLAN:    Assessment & Plan by Problem: Active Problems:   * No active hospital problems. *   Ian Wolf is a 60 y.o. with pertinent PMH of back pain, cocaine use disorder, previously treated TB who presented with chest pain and admitted for bilateral PEs on hospital day 0  #Non-massive PE   Seen on CT Angio, no evidence of RH strain and is hemodynamically stable. Given his positive UDS this PE could be related to his cocaine use. However, patient reports a history of 4-5 polyps during a colonoscopy 10ish years ago though no report is available as well as a first degree relative with colon cancer. Recommend that patient be UTD on cancer screenings and be referred for a colonoscopy as an outpatient. No complaints of dark or tarry stools. No lower extremity pain or swelling but will order LE [place] to rule out DVT - f/u Vas Ultrasound LE - telemetry - xarelto 15 BID for 21 days then 20 mg daily - daily CBC  #Cocaine use disorder Will set patient up with resources upon discharge. - TOC consult - CIWA  Diet: Normal VTE: Xarelto IVF: None,None Code: Full  Prior to Admission Living Arrangement:  Unhoused Anticipated Discharge Location: Home  Dispo: Admit patient to Observation with expected length of stay less than 2 midnights.  Signed: Ilene Qua, MD Internal Medicine Resident PGY-1 Pager: [phone]  07/30/2021, 11:40 AM

## 2021-07-30 NOTE — Progress Notes (Signed)
ANTICOAGULATION CONSULT NOTE - Initial Consult  Pharmacy Consult for Xarelto Indication: pulmonary embolus  No Known Allergies  Patient Measurements:   Vital Signs: Temp: 97.8 F (36.6 C) (12/17 0741) Temp Source: Oral (12/17 0433) BP: 139/73 (12/17 1045) Pulse Rate: 93 (12/17 1045)  Labs: Recent Labs    07/30/21 0443 07/30/21 0802  HGB 13.4  --   HCT 40.9  --   PLT 309  --   CREATININE 0.85  --   TROPONINIHS 5 5    CrCl cannot be calculated (Unknown ideal weight.).   Medical History: Past Medical History:  Diagnosis Date   Chronic back pain    Chronic back pain    Chronic knee pain    Cocaine addiction (HCC)    Post traumatic stress disorder (PTSD)    Tuberculosis 1995    Medications:  (Not in a hospital admission)  Scheduled:  Infusions:   Assessment: 60 yom with a history of back pain and cocaine addiction. Patient presenting with chest pain. CTA PE with PE in the left lower lobar, segmental, and subsegmental branches as well as the right medial basilar segmental branches. No CT evidence of right heart strain.[facility] Kitchen Pharmacy consulted for Xarelto for PE.  Patient is not on anticoagulation at home.  Hgb 13.4; plt 309 SCr 0.85  Goal of Therapy:  Monitor platelets by anticoagulation protocol: Yes   Plan:  Xarelto 15mg  BID x 21 days then 20mg  daily Monitor for s/s of hemorrhage Will need education prior to discharge on proper use and precautions of use  , PharmD, BCPS 07/30/2021 11:12 AM ED Clinical Pharmacist -  [phone]

## 2021-07-30 NOTE — Plan of Care (Signed)

## 2021-07-30 NOTE — Discharge Instructions (Addendum)
Ian Wolf  You were recently admitted to [facility] for chest pain. We found blood clots in your lungs as well as your leg veins. We started you on a blood thinner to try and prevent more of these clots from developing as they could be really dangerous to your health.  We have the following recommendations for you   Start taking Xarelto 15 mg twice a day for a total of 21 days (end 08/19/20). Then you will start taking 20 mg once a day indefinitely Follow up with the vascular surgeon: he can continue to help with the clots in your legs Schedule a colonoscopy- given your family history of colon cancer and the blood clots in your legs we should make sure you are healthy Start using compression stockings. Wear these during the day and take these off at night Stop using cocaine as it could cause you to have further complications with your heart and lungs.   You should seek further medical care if you have further chest pain, shortness of breath, increasing swelling in your legs, or new inability to walk.  We recommend that you see your primary care doctor in about a week to make sure that you continue to improve. We are so glad that you are feeling better.  Sincerely, Ilene Qua, MD  Information on my medicine - XARELTO (rivaroxaban)  WHY WAS XARELTO PRESCRIBED FOR YOU? Xarelto was prescribed to treat blood clots that may have been found in the veins of your legs (deep vein thrombosis) or in your lungs (pulmonary embolism) and to reduce the risk of them occurring again.  What do you need to know about Xarelto? The starting dose is one 15 mg tablet taken TWICE daily with food for the FIRST 21 DAYS then on (enter date)  ***  the dose is changed to one 20 mg tablet taken ONCE A DAY with your evening meal.  DO NOT stop taking Xarelto without talking to the health care provider who prescribed the medication.  Refill your prescription for 20 mg tablets before you run out.  After  discharge, you should have regular check-up appointments with your healthcare provider that is prescribing your Xarelto.  In the future your dose may need to be changed if your kidney function changes by a significant amount.  What do you do if you miss a dose? If you are taking Xarelto TWICE DAILY and you miss a dose, take it as soon as you remember. You may take two 15 mg tablets (total 30 mg) at the same time then resume your regularly scheduled 15 mg twice daily the next day.  If you are taking Xarelto ONCE DAILY and you miss a dose, take it as soon as you remember on the same day then continue your regularly scheduled once daily regimen the next day. Do not take two doses of Xarelto at the same time.   Important Safety Information Xarelto is a blood thinner medicine that can cause bleeding. You should call your healthcare provider right away if you experience any of the following: Bleeding from an injury or your nose that does not stop. Unusual colored urine (red or dark brown) or unusual colored stools (red or black). Unusual bruising for unknown reasons. A serious fall or if you hit your head (even if there is no bleeding).  Some medicines may interact with Xarelto and might increase your risk of bleeding while on Xarelto. To help avoid this, consult your healthcare provider or pharmacist prior  to using any new prescription or non-prescription medications, including herbals, vitamins, non-steroidal anti-inflammatory drugs (NSAIDs) and supplements.  This website has more information on Xarelto: VisitDestination.com.br.

## 2021-07-30 NOTE — ED Provider Notes (Signed)
[facility] EMERGENCY DEPARTMENT Provider Note   CSN: [id number] Arrival date & time: 07/30/21  0426     History Chief Complaint  Patient presents with   Chest Pain    Ian Wolf is a 60 y.o. male.  HPI  HPI: A 60 year old patient presents for evaluation of chest pain. Initial onset of pain was approximately 3-6 hours ago. The patient's chest pain is described as heaviness/pressure/tightness and is worse with exertion. The patient's chest pain is middle- or left-sided, is not well-localized, is not sharp and does not radiate to the arms/jaw/neck. The patient does not complain of nausea and denies diaphoresis. The patient has smoked in the past 90 days and has a family history of coronary artery disease in a first-degree relative with onset less than age 63. The patient has no history of stroke, has no history of peripheral artery disease, denies any history of treated diabetes, is not hypertensive, has no history of hypercholesterolemia and does not have an elevated BMI (>=30).  61 year old male reports that Ian Wolf was out drinking with his friends until about 2 AM.  After Ian Wolf got home Ian Wolf began having some substernal chest pain.  Ian Wolf is not able to describe it only as pain in his chest.  Ian Wolf states it is an 8 out of 10 and continues to be an 8 out of 10.  Ian Wolf reports that Ian Wolf received aspirin and what sounds like a sublingual nitroglycerin in route.  Pain has continued 8 out of 10 without relief.  Ian Wolf reports that the tip of his nose itches.  Ian Wolf reports some dyspnea and some increased pain with exertion.  Historically Ian Wolf has a diagnosis of cocaine abuse.  Ian Wolf reports Ian Wolf has not used cocaine for at least 5 months.  Review of records from [facility] reveal who presented for high blood pressure urine drug screen positive for cocaine in October.  CBC normal, [place] normal, contaminates is normal, on that evaluation.  Past Medical History:  Diagnosis Date   Chronic back pain     Chronic back pain    Chronic knee pain    Cocaine addiction ([place])    Post traumatic stress disorder (PTSD)    Tuberculosis 1995    Patient Active Problem List   Diagnosis Date Noted   Cocaine abuse ([place]) 03/18/2012   URTI (acute upper respiratory infection) 03/17/2012   Chest pain 03/16/2012   Cough 03/16/2012   History of TB (tuberculosis) 03/16/2012   KNEE PAIN, RIGHT 12/22/2009   FRACTURE, PATELLA 12/22/2009    Past Surgical History:  Procedure Laterality Date   KNEE SURGERY     L       History reviewed. No pertinent family history.  Social History   Tobacco Use   Smoking status: Never   Smokeless tobacco: Never  Substance Use Topics   Alcohol use: Yes    Alcohol/week: 1.0 standard drink    Types: 1 Cans of beer per week   Drug use: Yes    Types: Cocaine    Home Medications Prior to Admission medications   Medication Sig Start Date End Date Taking? Authorizing Provider  acetaminophen (TYLENOL) 500 MG tablet Take 500 mg by mouth every 6 (six) hours as needed for mild pain.    [provider]  Aspirin-Acetaminophen (GOODY BODY PAIN) 500-325 MG PACK Take 1 Package by mouth daily as needed (pain).    [provider]  cyclobenzaprine (FLEXERIL) 10 MG tablet Take 1 tablet (10 mg total)  by mouth 2 (two) times daily as needed for muscle spasms. Patient not taking: Reported on 01/08/2017 06/16/16   Montine Circle, PA-C  HYDROcodone-acetaminophen (NORCO/VICODIN) 5-325 MG per tablet Take 1 tablet by mouth every 6 (six) hours as needed for moderate pain or severe pain. Patient not taking: Reported on 01/08/2017 05/18/14   Gustavus Bryant, MD  ibuprofen (ADVIL,MOTRIN) 800 MG tablet Take 1 tablet (800 mg total) by mouth 3 (three) times daily. 09/07/14   Domenic Moras, PA-C  naproxen (NAPROSYN) 500 MG tablet Take 1 tablet (500 mg total) by mouth 2 (two) times daily. Patient not taking: Reported on 01/08/2017 10/21/16   Charlann Lange, PA-C  Polyvinyl  Alcohol-Povidone (REFRESH OP) Place 1 drop into both eyes daily as needed (irritation/dry eyes).    [provider]    Allergies    Patient has no known allergies.  Review of Systems   Review of Systems  All other systems reviewed and are negative.  Physical Exam Updated Vital Signs BP 139/73    Pulse 93    Temp 97.8 F (36.6 C)    Resp 19    SpO2 100%   Physical Exam Vitals and nursing note reviewed.  Constitutional:      Appearance: Ian Wolf is well-developed.  HENT:     Head: Normocephalic.  Eyes:     Pupils: Pupils are equal, round, and reactive to light.  Cardiovascular:     Rate and Rhythm: Normal rate and regular rhythm.     Heart sounds: Normal heart sounds.  Pulmonary:     Effort: Pulmonary effort is normal.     Breath sounds: Normal breath sounds.  Abdominal:     General: Bowel sounds are normal.     Palpations: Abdomen is soft.  Musculoskeletal:        General: Normal range of motion.     Cervical back: Normal range of motion.     Comments: Trace edema bilateral lower extremities  Skin:    General: Skin is warm and dry.     Capillary Refill: Capillary refill takes less than 2 seconds.  Neurological:     General: No focal deficit present.     Mental Status: Ian Wolf is alert.  Psychiatric:        Mood and Affect: Mood normal.        Behavior: Behavior normal.    ED Results / Procedures / Treatments   Labs (all labs ordered are listed, but only abnormal results are displayed) Labs Reviewed  BASIC METABOLIC PANEL - Abnormal; Notable for the following components:      Result Value   CO2 18 (*)    BUN 6 (*)    All other components within normal limits  CBC  LIPASE, BLOOD  RAPID URINE DRUG SCREEN, HOSP PERFORMED  TROPONIN I (HIGH SENSITIVITY)  TROPONIN I (HIGH SENSITIVITY)    EKG EKG Interpretation  Date/Time:  Saturday July 30 2021 04:18:16 EST Ventricular Rate:  87 PR Interval:  152 QRS Duration: 82 QT Interval:  344 QTC  Calculation: 413 R Axis:   62 Text Interpretation: Normal sinus rhythm Nonspecific T wave abnormality Abnormal ECG Confirmed by Thamas Jaegers ([id number]) on 07/30/2021 4:35:39 AM  Radiology DG Chest 2 View  Result Date: 07/30/2021 CLINICAL DATA:  60 year old male with history of chest pain. EXAM: CHEST - 2 VIEW COMPARISON:  Chest x-Kunaal Walkins 05/18/2014. FINDINGS: Lung volumes are normal. No consolidative airspace disease. No pleural effusions. No pneumothorax. 7 mm nodule projecting over the lateral aspect of  the lower right lung, only confidently identified on the frontal projection. Pulmonary vasculature and the cardiomediastinal silhouette are within normal limits. IMPRESSION: 1. Small nodular density seen only on the frontal view projecting over the lower right lung. This may represent a prominent nipple shadow. Follow-up standing PA and lateral chest radiograph is recommended in 3-4 weeks to ensure the stability or regression of this finding. The follow-up study should be performed with nipple markers in place. 2. No radiographic evidence of acute cardiopulmonary disease. Electronically Signed   By: Vinnie Langton M.D.   On: 07/30/2021 05:17   CT Angio Chest PE W and/or Wo Contrast  Result Date: 07/30/2021 CLINICAL DATA:  Chest pain or SOB, pleurisy or effusion suspected EXAM: CT ANGIOGRAPHY CHEST WITH CONTRAST TECHNIQUE: Multidetector CT imaging of the chest was performed using the standard protocol during bolus administration of intravenous contrast. Multiplanar CT image reconstructions and MIPs were obtained to evaluate the vascular anatomy. CONTRAST:  23mL OMNIPAQUE IOHEXOL 350 MG/ML SOLN COMPARISON:  Chest CT 03/11/2011 FINDINGS: Cardiovascular: There are bilateral pulmonary emboli, in the left lower lobar, segmental and subsegmental branches as well as the right medial basilar segmental branches. There is no CT evidence of right heart strain. The thoracic aorta is unremarkable. Normal cardiac size. No  pericardial disease. Mediastinum/Nodes: There are few prominent right hilar lymph nodes, likely reactive. Thyroid is unremarkable. The trachea is unremarkable. The esophagus is unremarkable. Lungs/Pleura: No focal airspace consolidation. Bibasilar hypoventilatory changes. No suspicious pulmonary nodules or masses. Left basilar scarring/linear atelectasis. The airways are patent. No pleural effusion. No pneumothorax. Upper Abdomen: No acute abnormality. Musculoskeletal: No acute osseous abnormality. No suspicious lytic or blastic lesions. Review of the MIP images confirms the above findings. IMPRESSION: Positive for pulmonary emboli in the left lower lobar, segmental, and subsegmental branches as well as the right medial basilar segmental branches. No CT evidence of right heart strain. Critical Value/emergent results were called by telephone at the time of interpretation on 07/30/2021 at 10:45 am to provider [facility] Cornie Mccomber , who verbally acknowledged these results. Electronically Signed   By: Maurine Simmering M.D.   On: 07/30/2021 10:50   DG Chest Port 1 View  Result Date: 07/30/2021 CLINICAL DATA:  Chest pain EXAM: PORTABLE CHEST 1 VIEW COMPARISON:  Radiograph 07/30/2021 FINDINGS: Unchanged cardiomediastinal silhouette. No new airspace disease. No pleural effusion. No pneumothorax. No acute osseous abnormality. The nodular density seen on recent chest radiograph performed earlier today is not visible and was likely a nipple shadow. IMPRESSION: No evidence of acute cardiopulmonary disease. Electronically Signed   By: Maurine Simmering M.D.   On: 07/30/2021 09:04    Procedures Procedures   Medications Ordered in ED Medications  nitroGLYCERIN (NITROSTAT) SL tablet 0.4 mg (0.4 mg Sublingual Given 07/30/21 1038)  iohexol (OMNIPAQUE) 350 MG/ML injection 80 mL (80 mLs Intravenous Contrast Given 07/30/21 1035)    ED Course  I have reviewed the triage vital signs and the nursing notes.  Pertinent labs & imaging results  that were available during my care of the patient were reviewed by me and considered in my medical decision making (see chart for details).  Clinical Course as of 07/30/21 1113  Sat Jul 30, 2021  0855 CBC reviewed and normal Be met reviewed with some decreased CO2 otherwise within normal limits [DR]  0856 Chest x-Bailie Christenbury personally reviewed and nodule noted in right lower lobe Radiology reading reviewed and nodularity noted will need outpatient follow-up [DR]    Clinical Course User Index [DR] Aubre Quincy,  Andee Poles, MD   MDM Rules/Calculators/A&P HEAR Score: 4                       Patient seen and evaluated for chest pain.  Differential diagnosis of serious/life threatening causes of chest pain includes ACS, other diseases of the heart such as myocarditis or pericarditis, lung etiologies such as infection or pneumothorax, diseases of the great vessels such as aortic dissection or AAA, pulmonary embolism, or GI sources such as cholecystitis or other upper abdominal causes. Doubt ACS- heart score documented, EKG reviewed, Given the timing of pain to ER presentation, delta troponin 5 and 5was unchanged and not elevated so doubt NSTEMI troponin and repeat troponin obtained and WNL Doubt myocarditis/pericarditis/tamponade based on history, review of ekg and labs Doubt aortic dissection based on history and review of imaging Doubt intrinsic lung causes such as pneumonia or pneumothorax, based on history, physical exam, and studies obtained. Doubt acute GI etiology requiring intervention based on history, physical exam and labs.  CTA obtained and positive for bilateral lower lobe pes.  No heart strain noted. Discussed with Dr.Khan, radiology.  Plan anticoagulation and admit for further treatmet and evaluation Plan xarelto Discussed with interventions and teaching service and they will see for admission patient aware of plan and in agreement  Final Clinical Impression(s) / ED Diagnoses Final diagnoses:   Pulmonary nodule  Chest pain, unspecified type  Other acute pulmonary embolism without acute cor pulmonale ([place])    Rx / DC Orders ED Discharge Orders     None        Pattricia Boss, MD 07/30/21 1147

## 2021-07-30 NOTE — ED Notes (Signed)
Patient transported to CT 

## 2021-07-30 NOTE — Progress Notes (Signed)
VASCULAR LAB    Bilateral lower extremity venous duplex has been performed.  See CV proc for preliminary results.   Anaria Kroner, RVT 07/30/2021, 12:54 PM

## 2021-07-30 NOTE — ED Triage Notes (Signed)
Pt comes via [facility] for CP that has been going on for an hour and half after ETOH use, PTA received 324 ASA and one nitro with no relief

## 2021-07-30 NOTE — ED Notes (Signed)
Patient transported to Vascular 

## 2021-07-30 NOTE — ED Notes (Signed)
Called 3E for purple man assignment.

## 2021-07-31 DIAGNOSIS — Z86718 Personal history of other venous thrombosis and embolism: Secondary | ICD-10-CM

## 2021-07-31 DIAGNOSIS — I82411 Acute embolism and thrombosis of right femoral vein: Secondary | ICD-10-CM

## 2021-07-31 DIAGNOSIS — M25569 Pain in unspecified knee: Secondary | ICD-10-CM | POA: Diagnosis not present

## 2021-07-31 DIAGNOSIS — I2699 Other pulmonary embolism without acute cor pulmonale: Secondary | ICD-10-CM | POA: Diagnosis not present

## 2021-07-31 LAB — BASIC METABOLIC PANEL
Anion gap: 6 (ref 5–15)
BUN: 8 mg/dL (ref 8–23)
CO2: 25 mmol/L (ref 22–32)
Calcium: 8.7 mg/dL — ABNORMAL LOW (ref 8.9–10.3)
Chloride: 106 mmol/L (ref 98–111)
Creatinine, Ser: 0.96 mg/dL (ref 0.61–1.24)
GFR, Estimated: >60 mL/min (ref >60)
Glucose, Bld: 102 mg/dL — ABNORMAL HIGH (ref 70–99)
Potassium: 3.8 mmol/L (ref 3.5–5.1)
Sodium: 137 mmol/L (ref 135–145)

## 2021-07-31 LAB — CBC
HCT: 35.8 % — ABNORMAL LOW (ref 39.0–52.0)
Hemoglobin: 12.2 g/dL — ABNORMAL LOW (ref 13.0–17.0)
MCH: 29.8 pg (ref 26.0–34.0)
MCHC: 34.1 g/dL (ref 30.0–36.0)
MCV: 87.5 fL (ref 80.0–100.0)
Platelets: 242 10*3/uL (ref 150–400)
RBC: 4.09 MIL/uL — ABNORMAL LOW (ref 4.22–5.81)
RDW: 14.8 % (ref 11.5–15.5)
WBC: 5.2 10*3/uL (ref 4.0–10.5)
nRBC: 0 % (ref 0.0–0.2)

## 2021-07-31 MED ORDER — RIVAROXABAN (XARELTO) VTE STARTER PACK (15 & 20 MG): ORAL_TABLET | ORAL | 0 refills | Status: DC

## 2021-07-31 NOTE — Consult Note (Addendum)
Hospital Consult    Reason for Consult: Bilateral lower extremity DVTs Requesting Physician: Dr. Criselda Peaches MRN #:  [id number]  History of Present Illness: This is a 60 y.o. male who presented to the hospital yesterday with chest pain, shortness of breath, bilateral lower extremity pain.  Further examination revealed pulmonary embolism as well as bilateral lower extremity DVT.  Vascular surgery was consulted for management of bilateral lower extremity DVT, specifically possible endovascular intervention.  On exam, Ian Wolf was doing well.  He noted heaviness in bilateral legs, with mild pain.  He stated his chest pain which he presented with, has improved slightly.  He was not short of breath. 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  His brother has also had DVTs and is currently on anticoagulation.  He is unsure of any other family history.  His last colonoscopy was 12 years ago.  His father died of colon cancer.  Ian Wolf currently resides in [place] at the [facility] home.  Past Medical History:  Diagnosis Date   Chronic back pain    Chronic back pain    Chronic knee pain    Cocaine addiction ([facility])    Post traumatic stress disorder (PTSD)    Tuberculosis 1995    Past Surgical History:  Procedure Laterality Date   KNEE SURGERY     L    No Known Allergies  Prior to Admission medications   Medication Sig Start Date End Date Taking? Authorizing Provider  acetaminophen (TYLENOL) 500 MG tablet Take 1,000 mg by mouth every 6 (six) hours as needed for headache (pain).   Yes [provider]  Aspirin-Acetaminophen 500-325 MG PACK Take 1 packet by mouth daily as needed (pain).   Yes [provider]  Polyvinyl Alcohol-Povidone (REFRESH OP) Place 1 drop into both eyes daily as needed (irritation/dry eyes).   Yes [provider]    Social History   Socioeconomic History   Marital status: Divorced    Spouse name: Not on file   Number of  children: Not on file   Years of education: Not on file   Highest education level: Not on file  Occupational History   Not on file  Tobacco Use   Smoking status: Never   Smokeless tobacco: Never  Substance and Sexual Activity   Alcohol use: Yes    Alcohol/week: 1.0 standard drink    Types: 1 Cans of beer per week   Drug use: Yes    Types: Cocaine   Sexual activity: Not on file  Other Topics Concern   Not on file  Social History Narrative   Not on file   Social Determinants of Health   Financial Resource Strain: Not on file  Food Insecurity: Not on file  Transportation Needs: Not on file  Physical Activity: Not on file  Stress: Not on file  Social Connections: Not on file  Intimate Partner Violence: Not on file     History reviewed. No pertinent family history.  ROS: Otherwise negative unless mentioned in HPI  Physical Examination  Vitals:   07/31/21 0353 07/31/21 0725  BP: 120/74 122/85  Pulse: 70 71  Resp: 19 20  Temp: 97.7 F (36.5 C) 98.3 F (36.8 C)  SpO2: 100% 100%   Body mass index is 28.82 kg/m.  General:  WDWN in NAD Gait: Not observed HENT: WNL, normocephalic Pulmonary: normal non-labored breathing, without Rales, rhonchi,  wheezing Cardiac: regular,  Abdomen: soft,  Skin: without rashes Vascular Exam/Pulses:  2+ PT and DP bilaterally Extremities: without ischemic changes, without Gangrene , without cellulitis; without open wounds;  Musculoskeletal: no muscle wasting or atrophy  Neurologic: A&O X 3;  No focal weakness or paresthesias are detected; speech is fluent/normal Psychiatric:  The pt has Normal affect. Lymph:  Unremarkable  CBC    Component Value Date/Time   WBC 5.2 07/31/2021 0655   RBC 4.09 (L) 07/31/2021 0655   HGB 12.2 (L) 07/31/2021 0655   HCT 35.8 (L) 07/31/2021 0655   PLT 242 07/31/2021 0655   MCV 87.5 07/31/2021 0655   MCH 29.8 07/31/2021 0655   MCHC 34.1 07/31/2021 0655   RDW 14.8 07/31/2021 0655   LYMPHSABS 1.0  02/09/2014 2239   MONOABS 1.6 (H) 02/09/2014 2239   EOSABS 0.0 02/09/2014 2239   BASOSABS 0.0 02/09/2014 2239    BMET    Component Value Date/Time   NA 137 07/31/2021 0655   K 3.8 07/31/2021 0655   CL 106 07/31/2021 0655   CO2 25 07/31/2021 0655   GLUCOSE 102 (H) 07/31/2021 0655   BUN 8 07/31/2021 0655   CREATININE 0.96 07/31/2021 0655   CALCIUM 8.7 (L) 07/31/2021 0655   GFRNONAA >60 07/31/2021 0655   GFRAA >60 01/08/2017 1730    COAGS: Lab Results  Component Value Date   INR 0.99 05/18/2014     Non-Invasive Vascular Imaging:   Extensive DVT in the right lower extremity extending from the tibial veins through the femoral vein.  No thrombus appreciated in the common femoral vein DVT appreciated in the posterior tibial vein, peroneal vein  Statin:  No. Beta Blocker:  No. Aspirin:  No. ACEI:  No. ARB:  No. CCB use:  No Other antiplatelets/anticoagulants:  Yes.      ASSESSMENT/PLAN: This is a 60 y.o. male who presents with bilateral lower extremity DVTs.  Ian Wolf has a family history of DVTs as well as a personal history of DVTs, with one occurring 5 years ago.  He has not been on systemic anticoagulation.  Imaging was reviewed demonstrating patient's right lower extremity DVT ends in the femoral vein.  No common femoral vein or iliac vein involvement.  Patient has normal pulses bilaterally, able to ambulate.  Patient would benefit from bilateral thigh-high compression stockings 20 to 30 mmHg worn daily as well as lifetime anticoagulation. Patient also needs anticoagulation work-up.  A referral to hematology would be beneficial for this.  With his brothers history, there is likely a genetic component.  I am happy to see the patient in clinic this week for repeat duplex ultrasound to ensure no propagation now that Xarelto has been initiated. I will coordinate his appointment   No plan for operative intervention.    Fara Olden MD [facility] [phone] 07/31/2021  10:51 AM

## 2021-07-31 NOTE — Discharge Summary (Addendum)
Name: Ian Wolf MRN: [id number] DOB: [birth year] 60 y.o. PCP: [facility]  Date of Admission: 07/30/2021  4:26 AM Date of Discharge:   08/01/21 Attending Physician: Axel Filler, *  Discharge Diagnosis: 1. Non-massive PE 2. Bilateral DVTs  3. Cocaine use disorder  Discharge Medications: Allergies as of 08/01/2021   No Known Allergies      Medication List     STOP taking these medications    acetaminophen 500 MG tablet Commonly known as: TYLENOL   Aspirin-Acetaminophen 500-325 MG Pack   REFRESH OP       TAKE these medications    Rivaroxaban Stater Pack (15 mg and 20 mg) Commonly known as: [place] PACK Follow package directions: Take one 15mg  tablet by mouth twice a day. On day 22 (08/19/20), switch to one 20mg  tablet once a day. Take with food.        Disposition and follow-up:   Ian Wolf was discharged from [facility] in Stable condition.  At the hospital follow up visit please address:  1.  Non-massive PE; Bilateral DVTs - assess for leg swelling, ability to ambulate, and check oxygen saturation. Ensure patient is still taking his xarelto  2. Hypercoagulability- needs to be worked up, consider referral to hematology  3. Colon cancer screening- needs to get a colonoscopy  4. Cocaine use disorder- provide support and cessation counseling  5.  Labs / imaging needed at time of follow-up: Hypercoagulability workup, cbc  6.  Pending labs/ test needing follow-up: none  Follow-up Appointments:   Hospital Course by problem list: 1. Non-massive PE; Bilateral DVTs  Patient presented with chest pain and was found to have non-massive P emobli on CT angio in the setting of cocaine use. [place] of his bilateral lower extremities showed bilateral DVTs. Given the extent of his clot burden in the RL consulted vascular surgery who said there was no need for a procedure but to continue anticoagulation. He  should also use thigh high compression stocking. Given his family history of both colon cancer as well as blood clots in his brother he would benefit from an outpatient hypercoagulability workup as well as a colonoscopy. He will be discharged on xarelto 15 BID for a total of 21 days followed by 20 mg daily. He was able to ambulate without shortness of breath or desaturations. Discharged with outpatient PT.  2. Cocaine Use disorder Patient is interested in quitting and has tried rehab before. Says that he is motivated to quit but it is difficult and it has to be something you do for yourself. Recommend continued cessation counseling as an outpatient.   Discharge Subjective Patient feeling alright this morning. Was able to work with PT yesterday. He says that he feels a little weak but thinks that he is able to get around. He will need a ride today. Understands that he needs to take his blood thinner.  Discharge Exam:   BP 113/72 (BP Location: Left Arm)    Pulse 76    Temp 98.4 F (36.9 C) (Oral)    Resp 17    Ht 6' (1.829 m)    Wt 96.6 kg    SpO2 98%    BMI 28.89 kg/m  Constitutional: Middle aged man resting comfortably in bed, in no acute distress Cardiovascular: regular rate and rhythm, no m/r/g; trace edema of the RLE Pulmonary/Chest: normal work of breathing on room air, lungs clear to auscultation bilaterally Abdominal: soft, non-tender, non-distended MSK: normal  bulk and tone Neurological: alert & oriented x 3, answering questions appropriately Skin: warm and dry Psych: normal affect  Pertinent Labs, Studies, and Procedures:   DG Chest 2 View  Result Date: 07/30/2021 CLINICAL DATA:  60 year old male with history of chest pain. EXAM: CHEST - 2 VIEW COMPARISON:  Chest x-ray 05/18/2014. FINDINGS: Lung volumes are normal. No consolidative airspace disease. No pleural effusions. No pneumothorax. 7 mm nodule projecting over the lateral aspect of the lower right lung, only confidently  identified on the frontal projection. Pulmonary vasculature and the cardiomediastinal silhouette are within normal limits. IMPRESSION: 1. Small nodular density seen only on the frontal view projecting over the lower right lung. This may represent a prominent nipple shadow. Follow-up standing PA and lateral chest radiograph is recommended in 3-4 weeks to ensure the stability or regression of this finding. The follow-up study should be performed with nipple markers in place. 2. No radiographic evidence of acute cardiopulmonary disease. Electronically Signed   By: Trudie Reed M.D.   On: 07/30/2021 05:17   CT Angio Chest PE W and/or Wo Contrast  Result Date: 07/30/2021 CLINICAL DATA:  Chest pain or SOB, pleurisy or effusion suspected EXAM: CT ANGIOGRAPHY CHEST WITH CONTRAST TECHNIQUE: Multidetector CT imaging of the chest was performed using the standard protocol during bolus administration of intravenous contrast. Multiplanar CT image reconstructions and MIPs were obtained to evaluate the vascular anatomy. CONTRAST:  35mL OMNIPAQUE IOHEXOL 350 MG/ML SOLN COMPARISON:  Chest CT 03/11/2011 FINDINGS: Cardiovascular: There are bilateral pulmonary emboli, in the left lower lobar, segmental and subsegmental branches as well as the right medial basilar segmental branches. There is no CT evidence of right heart strain. The thoracic aorta is unremarkable. Normal cardiac size. No pericardial disease. Mediastinum/Nodes: There are few prominent right hilar lymph nodes, likely reactive. Thyroid is unremarkable. The trachea is unremarkable. The esophagus is unremarkable. Lungs/Pleura: No focal airspace consolidation. Bibasilar hypoventilatory changes. No suspicious pulmonary nodules or masses. Left basilar scarring/linear atelectasis. The airways are patent. No pleural effusion. No pneumothorax. Upper Abdomen: No acute abnormality. Musculoskeletal: No acute osseous abnormality. No suspicious lytic or blastic lesions. Review  of the MIP images confirms the above findings. IMPRESSION: Positive for pulmonary emboli in the left lower lobar, segmental, and subsegmental branches as well as the right medial basilar segmental branches. No CT evidence of right heart strain. Critical Value/emergent results were called by telephone at the time of interpretation on 07/30/2021 at 10:45 am to provider [facility] RAY , who verbally acknowledged these results. Electronically Signed   By: Caprice Renshaw M.D.   On: 07/30/2021 10:50   DG Chest Port 1 View  Result Date: 07/30/2021 CLINICAL DATA:  Chest pain EXAM: PORTABLE CHEST 1 VIEW COMPARISON:  Radiograph 07/30/2021 FINDINGS: Unchanged cardiomediastinal silhouette. No new airspace disease. No pleural effusion. No pneumothorax. No acute osseous abnormality. The nodular density seen on recent chest radiograph performed earlier today is not visible and was likely a nipple shadow. IMPRESSION: No evidence of acute cardiopulmonary disease. Electronically Signed   By: Caprice Renshaw M.D.   On: 07/30/2021 09:04   VAS [place] LOWER EXTREMITY VENOUS (DVT)  Result Date: 07/30/2021  Lower Venous DVT Study Patient Name:  DELMER KOWALSKI  Date of Exam:   07/30/2021 Medical Rec #: [id number]        Accession #:    [id number] Date of Birth: Oct 14, 1960       Patient Gender: M Patient Age:   24 years Exam [facility]  [facility]  Procedure:      VAS [place] LOWER EXTREMITY VENOUS (DVT) Referring Phys: Andee Poles RAY --------------------------------------------------------------------------------  Indications: Pulmonary embolism.  Comparison Study: No prior study on file Performing Technologist: Sharion Dove RVS  Examination Guidelines: A complete evaluation includes B-mode imaging, spectral Doppler, color Doppler, and power Doppler as needed of all accessible portions of each vessel. Bilateral testing is considered an integral part of a complete examination. Limited examinations for reoccurring indications may be performed  as noted. The reflux portion of the exam is performed with the patient in reverse Trendelenburg.  +---------+---------------+---------+-----------+----------+--------------+  RIGHT     Compressibility Phasicity Spontaneity Properties Thrombus Aging  +---------+---------------+---------+-----------+----------+--------------+  CFV       Full            Yes       Yes                                    +---------+---------------+---------+-----------+----------+--------------+  SFJ       Full                                                             +---------+---------------+---------+-----------+----------+--------------+  FV Prox   None            No        No                     Acute           +---------+---------------+---------+-----------+----------+--------------+  FV Mid    None            No        No                     Acute           +---------+---------------+---------+-----------+----------+--------------+  FV Distal None            No        No                     Acute           +---------+---------------+---------+-----------+----------+--------------+  PFV       Full            Yes       Yes                                    +---------+---------------+---------+-----------+----------+--------------+  POP       None            No        No                     Acute           +---------+---------------+---------+-----------+----------+--------------+  PTV       None                                             Acute           +---------+---------------+---------+-----------+----------+--------------+  PERO      None                                             Acute           +---------+---------------+---------+-----------+----------+--------------+  Gastroc   None            No        No                     Acute           +---------+---------------+---------+-----------+----------+--------------+   +---------+---------------+---------+-----------+----------+--------------+  LEFT       Compressibility Phasicity Spontaneity Properties Thrombus Aging  +---------+---------------+---------+-----------+----------+--------------+  CFV       Full            Yes       Yes                                    +---------+---------------+---------+-----------+----------+--------------+  SFJ       Full                                                             +---------+---------------+---------+-----------+----------+--------------+  FV Prox   Full                                                             +---------+---------------+---------+-----------+----------+--------------+  FV Mid    Full                                                             +---------+---------------+---------+-----------+----------+--------------+  FV Distal Full                                                             +---------+---------------+---------+-----------+----------+--------------+  PFV       Full                                                             +---------+---------------+---------+-----------+----------+--------------+  POP       Full            Yes       Yes                                    +---------+---------------+---------+-----------+----------+--------------+  PTV       None                                             Acute           +---------+---------------+---------+-----------+----------+--------------+  PERO      None                                             Acute           +---------+---------------+---------+-----------+----------+--------------+     Summary: BILATERAL: -No evidence of popliteal cyst, bilaterally. RIGHT: - Findings consistent with acute deep vein thrombosis involving the right femoral vein, right popliteal vein, right proximal profunda vein, right posterior tibial veins, right peroneal veins, and right gastrocnemius veins.  LEFT: - Findings consistent with acute deep vein thrombosis involving the left posterior tibial veins, and left peroneal veins.  *See  table(s) above for measurements and observations.    Preliminary     CBC Latest Ref Rng & Units 07/31/2021 07/30/2021 01/08/2017  WBC 4.0 - 10.5 K/uL 5.2 8.3 5.0  Hemoglobin 13.0 - 17.0 g/dL 12.2(L) 13.4 13.4  Hematocrit 39.0 - 52.0 % 35.8(L) 40.9 39.6  Platelets 150 - 400 K/uL 242 309 247    BMP Latest Ref Rng & Units 07/31/2021 07/30/2021 01/08/2017  Glucose 70 - 99 mg/dL 102(H) 94 96  BUN 8 - 23 mg/dL 8 6(L) 9  Creatinine 0.61 - 1.24 mg/dL 0.96 0.85 0.94  Sodium 135 - 145 mmol/L 137 137 138  Potassium 3.5 - 5.1 mmol/L 3.8 3.8 4.0  Chloride 98 - 111 mmol/L 106 108 106  CO2 22 - 32 mmol/L 25 18(L) 22  Calcium 8.9 - 10.3 mg/dL 8.7(L) 9.2 9.4    Discharge Instructions: Discharge Instructions     Ambulatory referral to Physical Therapy   Complete by: As directed    Call MD for:  difficulty breathing, headache or visual disturbances   Complete by: As directed    Call MD for:  persistant dizziness or light-headedness   Complete by: As directed    Call MD for:  persistant nausea and vomiting   Complete by: As directed    Diet - low sodium heart healthy   Complete by: As directed    Increase activity slowly   Complete by: As directed        Signed: Scarlett Presto, MD 08/01/2021, 11:04 AM   Pager: [phone]

## 2021-07-31 NOTE — Progress Notes (Addendum)
OT Cancellation Note  Patient Details Name: Ian Wolf MRN: [id number] DOB: Jul 29, 1961   Cancelled Treatment:    Reason Eval/Treat Not Completed: Medical issues which prohibited therapy (Pt found to have multiple DVTs, started anticoagulant around noon 12/17. Will complete OT evaluation this afternoon.)  Catia Todorov A Adrienne Delay 07/31/2021, 11:15 AM

## 2021-07-31 NOTE — Progress Notes (Signed)
Notified in report this morning that pt was found to have multiple PE"s, and DVT's in legs. Paged intern per attending note to inquire about the possibility of heparin, nothing ordered other than xarelto for VTE

## 2021-07-31 NOTE — Progress Notes (Signed)
HD#0 Subjective:  Overnight Events: NAEO   Patient was able to get up and go to the bathroom. Says he was able to walk around with help but felt a little bit week. He wants a note for his shelter that says he can rest in bed if need be.  Objective:  Vital signs in last 24 hours: Vitals:   07/31/21 0033 07/31/21 0353 07/31/21 0725 07/31/21 1107  BP: 113/73 120/74 122/85 130/85  Pulse: 65 70 71 69  Resp: 17 19 20 16   Temp: 98 F (36.7 C) 97.7 F (36.5 C) 98.3 F (36.8 C) 98.2 F (36.8 C)  TempSrc: Oral Oral Oral Oral  SpO2: 100% 100% 100% 100%  Weight:  96.4 kg    Height:       Supplemental O2: Room Air SpO2: 100 %   Physical Exam:  Constitutional: Middle aged man resting comfortably in bed, in no acute distress Cardiovascular: regular rate and rhythm, no m/r/g; trace edema of the RLE Pulmonary/Chest: normal work of breathing on room air, lungs clear to auscultation bilaterally Abdominal: soft, non-tender, non-distended MSK: normal bulk and tone Neurological: alert & oriented x 3, answering questions appropriately Skin: warm and dry Psych: normal affect  Filed Weights   07/30/21 1456 07/31/21 0353  Weight: 94.7 kg 96.4 kg     Intake/Output Summary (Last 24 hours) at 07/31/2021 1148 Last data filed at 07/31/2021 0800 Gross per 24 hour  Intake 1080 ml  Output 2175 ml  Net -1095 ml   Net IO Since Admission: -1,095 mL [07/31/21 1148]  Pertinent Labs: CBC Latest Ref Rng & Units 07/31/2021 07/30/2021 01/08/2017  WBC 4.0 - 10.5 K/uL 5.2 8.3 5.0  Hemoglobin 13.0 - 17.0 g/dL 12.2(L) 13.4 13.4  Hematocrit 39.0 - 52.0 % 35.8(L) 40.9 39.6  Platelets 150 - 400 K/uL 242 309 247    CMP Latest Ref Rng & Units 07/31/2021 07/30/2021 01/08/2017  Glucose 70 - 99 mg/dL 102(H) 94 96  BUN 8 - 23 mg/dL 8 6(L) 9  Creatinine 0.61 - 1.24 mg/dL 0.96 0.85 0.94  Sodium 135 - 145 mmol/L 137 137 138  Potassium 3.5 - 5.1 mmol/L 3.8 3.8 4.0  Chloride 98 - 111 mmol/L 106 108 106   CO2 22 - 32 mmol/L 25 18(L) 22  Calcium 8.9 - 10.3 mg/dL 8.7(L) 9.2 9.4  Total Protein 6.0 - 8.3 g/dL - - -  Total Bilirubin 0.3 - 1.2 mg/dL - - -  Alkaline Phos 39 - 117 U/L - - -  AST 0 - 37 U/L - - -  ALT 0 - 53 U/L - - -    Imaging: VAS [place] LOWER EXTREMITY VENOUS (DVT)  Result Date: 07/30/2021  Lower Venous DVT Study Patient Name:  SERENITY RAPA  Date of Exam:   07/30/2021 Medical Rec #: [id number]        Accession #:    [id number] Date of Birth: 09-01-1960       Patient Gender: M Patient Age:   60 years Exam Location:  [facility] Procedure:      VAS [place] LOWER EXTREMITY VENOUS (DVT) Referring Phys: Andee Poles RAY --------------------------------------------------------------------------------  Indications: Pulmonary embolism.  Comparison Study: No prior study on file Performing Technologist: Sharion Dove RVS  Examination Guidelines: A complete evaluation includes B-mode imaging, spectral Doppler, color Doppler, and power Doppler as needed of all accessible portions of each vessel. Bilateral testing is considered an integral part of a complete examination. Limited examinations for reoccurring indications may  be performed as noted. The reflux portion of the exam is performed with the patient in reverse Trendelenburg.  +---------+---------------+---------+-----------+----------+--------------+  RIGHT     Compressibility Phasicity Spontaneity Properties Thrombus Aging  +---------+---------------+---------+-----------+----------+--------------+  CFV       Full            Yes       Yes                                    +---------+---------------+---------+-----------+----------+--------------+  SFJ       Full                                                             +---------+---------------+---------+-----------+----------+--------------+  FV Prox   None            No        No                     Acute           +---------+---------------+---------+-----------+----------+--------------+   FV Mid    None            No        No                     Acute           +---------+---------------+---------+-----------+----------+--------------+  FV Distal None            No        No                     Acute           +---------+---------------+---------+-----------+----------+--------------+  PFV       Full            Yes       Yes                                    +---------+---------------+---------+-----------+----------+--------------+  POP       None            No        No                     Acute           +---------+---------------+---------+-----------+----------+--------------+  PTV       None                                             Acute           +---------+---------------+---------+-----------+----------+--------------+  PERO      None                                             Acute           +---------+---------------+---------+-----------+----------+--------------+  Harlow Ohms  None            No        No                     Acute           +---------+---------------+---------+-----------+----------+--------------+   +---------+---------------+---------+-----------+----------+--------------+  LEFT      Compressibility Phasicity Spontaneity Properties Thrombus Aging  +---------+---------------+---------+-----------+----------+--------------+  CFV       Full            Yes       Yes                                    +---------+---------------+---------+-----------+----------+--------------+  SFJ       Full                                                             +---------+---------------+---------+-----------+----------+--------------+  FV Prox   Full                                                             +---------+---------------+---------+-----------+----------+--------------+  FV Mid    Full                                                             +---------+---------------+---------+-----------+----------+--------------+  FV Distal Full                                                              +---------+---------------+---------+-----------+----------+--------------+  PFV       Full                                                             +---------+---------------+---------+-----------+----------+--------------+  POP       Full            Yes       Yes                                    +---------+---------------+---------+-----------+----------+--------------+  PTV       None                                             Acute           +---------+---------------+---------+-----------+----------+--------------+  PERO      None                                             Acute           +---------+---------------+---------+-----------+----------+--------------+     Summary: BILATERAL: -No evidence of popliteal cyst, bilaterally. RIGHT: - Findings consistent with acute deep vein thrombosis involving the right femoral vein, right popliteal vein, right proximal profunda vein, right posterior tibial veins, right peroneal veins, and right gastrocnemius veins.  LEFT: - Findings consistent with acute deep vein thrombosis involving the left posterior tibial veins, and left peroneal veins.  *See table(s) above for measurements and observations.    Preliminary     Assessment/Plan:   Principal Problem:   Pulmonary embolism [facility]   Patient Summary: Ian Wolf is a 60 y.o. with pertinent PMH of back pain, cocaine use disorder, previously treated TB who presented with chest pain and admitted for bilateral Pes and bilateral DVTs   #Non-massive PE # Bilateral DVTs  US yesterday identified bilateral DVTs with the right having extensive involvement but no involvement of the iliac veins. Consulted vascular surgery who says that there is nothing procedural to be done. They will follow up with him in clinic. He is hemodynamically stable and likely able to go home today. Will follow up on PT/OT recs before discharging patient. - PT/OT eval and treat - thigh high compression  stockings - anticoagulation workup recommended, consider referral to hematology - continue xarelto - outpatient colonoscopy - telemetry - xarelto 15 BID for 21 days then 20 mg daily - daily CBC   #Cocaine use disorder Will set patient up with resources upon discharge. - TOC consult - CIWA   Diet: Normal VTE: Xarelto IVF: None,None Code: Full  Ilene Qua, MD Internal Medicine Resident PGY-1 Pager [phone] Please contact the on call pager after 5 pm and on weekends at [phone].

## 2021-07-31 NOTE — Progress Notes (Signed)
Pt stated he was unaware of being D/C'd tonight but that he had spoken to shelter in [place] earlier today and he would be able to D/C back tomorrow but not today. Paged Dr Austin Miles via Loretha Stapler.

## 2021-07-31 NOTE — Evaluation (Signed)
Physical Therapy Evaluation Patient Details Name: Ian Wolf MRN: [id number] DOB: 07/09/1961 Today's Date: 07/31/2021  History of Present Illness  Ian Wolf is a 60 y/o male who presented on 12/17 with chest pain. 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 History of back pain and cocaine addiction  Clinical Impression   Pt admitted with above diagnosis. Ian Wolf was indep PTA with intermittent use of SPC and reports of a few falls due to his R knee buckling. He lives at [facility] home in [place] with elevator access and handicap accessible bathrooms. Upon evaluation pt was mod I-supervision level for all functional mobility and ADLs, which is likely his baseline. Pt stated that he prefers not to take medication at d/c, educated on DVT/PE and the importance of medication management. Pt verbalized acceptance/understanding. Will plan on gait training with RW vs cane to discern best assistive device; Pt currently with functional limitations due to the deficits listed below (see PT Problem List). Pt will benefit from skilled PT to increase their independence and safety with mobility to allow discharge to the venue listed below.    In an ideal situation, Ian Wolf would benefit from Outpt PT to take a closer look at his R knww and help to decr fall risk.        Recommendations for follow up therapy are one component of a multi-disciplinary discharge planning process, led by the attending physician.  Recommendations may be updated based on patient status, additional functional criteria and insurance authorization.  Follow Up Recommendations Outpatient PT    Assistance Recommended at Discharge None  Functional Status Assessment Patient has had a recent decline in their functional status and demonstrates the ability to make significant improvements in function in a reasonable and predictable amount of time.  Equipment Recommendations  Rolling walker (2 wheels)  (versus cane)    Recommendations for Other Services       Precautions / Restrictions Precautions Precautions: Fall Precaution Comments: previously injured R knee; will buckle and result in a fall per pt Restrictions Weight Bearing Restrictions: No      Mobility  Bed Mobility Overal bed mobility: Modified Independent Bed Mobility: Supine to Sit     Supine to sit: Modified independent (Device/Increase time)     General bed mobility comments: HOB slightly elevated    Transfers Overall transfer level: Needs assistance Equipment used: None Transfers: Sit to/from Stand Sit to Stand: Supervision           General transfer comment: Cues to self monitor for activity tolerance    Ambulation/Gait Ambulation/Gait assistance: Min assist Gait Distance (Feet): 200 Feet Assistive device: 1 person hand held assist (Pt's R hand on PT's shoulder) Gait Pattern/deviations: Step-through pattern (slightly antalgic) Gait velocity: slowed     General Gait Details: Slow steps, slightly antalgic, favoring decr stance time RLE  Careers information officer    Modified Rankin (Stroke Patients Only)       Balance Overall balance assessment: Needs assistance Sitting-balance support: Feet supported Sitting balance-Leahy Scale: Good     Standing balance support: No upper extremity supported;During functional activity Standing balance-Leahy Scale: Good                               Pertinent Vitals/Pain Pain Assessment: Faces Faces Pain Scale: Hurts a little bit Pain Location: R chest Pain  Descriptors / Indicators: Aching Pain Intervention(s): Monitored during session    Home Living Family/patient expects to be discharged to:: Shelter/Homeless                   Additional Comments: Pt has been residing at [facility] home for about a month. elevator access. handicap accessible bathroom, walk in shower & raised toilet seat.    Prior  Function Prior Level of Function : Independent/Modified Independent             Mobility Comments: Pt intermittently used a SPC. Reports some falls recently due to R knee "giving out" ADLs Comments: indep     Hand Dominance        Extremity/Trunk Assessment   Upper Extremity Assessment Upper Extremity Assessment: Defer to OT evaluation    Lower Extremity Assessment Lower Extremity Assessment: Generalized weakness;RLE deficits/detail RLE Deficits / Details: Pt describes previous injury and that his right knee can tend to "give way"       Communication   Communication: No difficulties  Cognition Arousal/Alertness: Awake/alert Behavior During Therapy: WFL for tasks assessed/performed Overall Cognitive Status: Within Functional Limits for tasks assessed                                 General Comments: limited insight into defictis & precautions. When asked about med management pt said he "prefers not to take medications." Educated pt on DVT/PE and importance of taking medications as prescribed. He verbalized understanding.        General Comments General comments (skin integrity, edema, etc.): VSS on RA    Exercises     Assessment/Plan    PT Assessment Patient needs continued PT services  PT Problem List Decreased strength;Decreased activity tolerance;Decreased balance;Decreased mobility;Decreased knowledge of use of DME;Decreased safety awareness;Decreased knowledge of precautions       PT Treatment Interventions DME instruction;Gait training;Stair training;Functional mobility training;Therapeutic activities;Therapeutic exercise;Balance training;Cognitive remediation;Patient/family education    PT Goals (Current goals can be found in the Care Plan section)  Acute Rehab PT Goals Patient Stated Goal: Agrees to walk; glad to be alive PT Goal Formulation: With patient Time For Goal Achievement: 08/14/21 Potential to Achieve Goals: Good    Frequency  Min 3X/week   Barriers to discharge        Co-evaluation               AM-PAC PT "6 Clicks" Mobility  Outcome Measure Help needed turning from your back to your side while in a flat bed without using bedrails?: None Help needed moving from lying on your back to sitting on the side of a flat bed without using bedrails?: None Help needed moving to and from a bed to a chair (including a wheelchair)?: None Help needed standing up from a chair using your arms (e.g., wheelchair or bedside chair)?: A Little Help needed to walk in hospital room?: A Little Help needed climbing 3-5 steps with a railing? : A Little 6 Click Score: 21    End of Session   Activity Tolerance: Patient tolerated treatment well Patient left: in chair;with call bell/phone within reach Nurse Communication: Mobility status PT Visit Diagnosis: Unsteadiness on feet (R26.81)    Time: 1450-1516 PT Time Calculation (min) (ACUTE ONLY): 26 min   Charges:   PT Evaluation $PT Eval Low Complexity: 1 Low PT Treatments $Gait Training: 8-22 mins        Van Clines, PT  Acute Rehabilitation Services Pager [phone] Office [phone]   Levi Aland 07/31/2021, 5:42 PM

## 2021-07-31 NOTE — Progress Notes (Signed)
PT Cancellation Note  Patient Details Name: Ian Wolf MRN: [id number] DOB: [birth year]   Cancelled Treatment:    Reason Eval/Treat Not Completed: Other (comment)  Pt found to have multiple DVTs, started anticoagulant around noon 12/17;   Plan to follow up for PT eval in the afternoon;   Van Clines, PT  Acute Rehabilitation Services Pager [phone] Office [phone]    Levi Aland 07/31/2021, 11:20 AM

## 2021-07-31 NOTE — Evaluation (Signed)
Occupational Therapy Evaluation Patient Details Name: Ian Wolf MRN: [id number] DOB: [birth year] Today's Date: 07/31/2021   History of Present Illness [name] is a 60 y/o male who presented on 12/17 with chest pain. 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 History of back pain and cocaine addiction   Clinical Impression   Annie was indep PTA with intermittent use of SPC and reports of a few falls due to his R knee buckling. He lives at [facility] home in [place] with elevator access and handicap accessible bathrooms. Upon evaluation pt was mod I-supervision level for all functional mobility and ADLs, which is likely his baseline. Pt stated that he prefers not to take medication at d/c, educated on DVT/PE and the importance of medication management. Pt verbalized acceptance/understanding. He does not have continued acute OT needs. Recommend d/c back to shelter.   Recommendations for follow up therapy are one component of a multi-disciplinary discharge planning process, led by the attending physician.  Recommendations may be updated based on patient status, additional functional criteria and insurance authorization.   Follow Up Recommendations  No OT follow up    Assistance Recommended at Discharge Intermittent Supervision/Assistance  Functional Status Assessment  Patient has had a recent decline in their functional status and demonstrates the ability to make significant improvements in function in a reasonable and predictable amount of time.  Equipment Recommendations  None recommended by OT       Precautions / Restrictions Precautions Precautions: Fall Restrictions Weight Bearing Restrictions: No      Mobility Bed Mobility Overal bed mobility: Needs Assistance Bed Mobility: Supine to Sit     Supine to sit: Modified independent (Device/Increase time)     General bed mobility comments: HOB slightly elevated    Transfers Overall  transfer level: Needs assistance Equipment used: None Transfers: Sit to/from Stand Sit to Stand: Supervision                  Balance Overall balance assessment: Needs assistance Sitting-balance support: Feet supported Sitting balance-Leahy Scale: Good     Standing balance support: No upper extremity supported;During functional activity Standing balance-Leahy Scale: Good                             ADL either performed or assessed with clinical judgement   ADL Overall ADL's : Needs assistance/impaired Eating/Feeding: Independent;Sitting   Grooming: Supervision/safety;Standing   Upper Body Bathing: Set up;Sitting   Lower Body Bathing: Supervison/ safety;Sit to/from stand   Upper Body Dressing : Set up;Sitting   Lower Body Dressing: Supervision/safety;Sit to/from stand   Toilet Transfer: Supervision/safety;Ambulation;Regular Toilet   Toileting- Clothing Manipulation and Hygiene: Supervision/safety;Sitting/lateral lean       Functional mobility during ADLs: Supervision/safety General ADL Comments: Pt is likely at his baseline for ADLs. Reports some chronic knee pain. Supervision for safety     Vision Baseline Vision/History: 0 No visual deficits Ability to See in Adequate Light: 0 Adequate Vision Assessment?: No apparent visual deficits      Extremity/Trunk Assessment Upper Extremity Assessment Upper Extremity Assessment: Overall WFL for tasks assessed   Lower Extremity Assessment Lower Extremity Assessment: Defer to PT evaluation       Communication Communication Communication: No difficulties   Cognition Arousal/Alertness: Awake/alert Behavior During Therapy: WFL for tasks assessed/performed Overall Cognitive Status: Within Functional Limits for tasks assessed  General Comments: limited insight into defictis & precautions. When asked about med management pt said he "prefers not to take medications." Educated pt on DVT/PE  and importance of taking medications as prescribed. He verbalized understanding.     General Comments  VSS on RA     Home Living Family/patient expects to be discharged to:: Shelter/Homeless         Additional Comments: Pt has been residing at [place] home for about a month. elevator access. handicap accessible bathroom, walk in shower & raised toilet seat.      Prior Functioning/Environment Prior Level of Function : Independent/Modified Independent             Mobility Comments: Pt intermittently used a SPC. Reports some falls recently due to R knee "giving out" ADLs Comments: indep        OT Problem List: Decreased activity tolerance;Decreased safety awareness;Decreased knowledge of precautions;Pain      OT Treatment/Interventions:      OT Goals(Current goals can be found in the care plan section) Acute Rehab OT Goals Patient Stated Goal: d/c today OT Goal Formulation: With patient/family   AM-PAC OT "6 Clicks" Daily Activity     Outcome Measure Help from another person eating meals?: None Help from another person taking care of personal grooming?: None Help from another person toileting, which includes using toliet, bedpan, or urinal?: A Little Help from another person bathing (including washing, rinsing, drying)?: A Little Help from another person to put on and taking off regular upper body clothing?: None Help from another person to put on and taking off regular lower body clothing?: A Little 6 Click Score: 21   End of Session Nurse Communication: Mobility status  Activity Tolerance: Patient tolerated treatment well Patient left: Other (comment) (Hand off to PT)  OT Visit Diagnosis: History of falling (Z91.81);Pain                Time: 1435-1451 OT Time Calculation (min): 16 min Charges:  OT General Charges $OT Visit: 1 Visit OT Evaluation $OT Eval Low Complexity: 1 Low  Maniyah Moller A Kortlynn Poust 07/31/2021, 3:03 PM

## 2021-07-31 NOTE — Plan of Care (Signed)

## 2021-08-01 ENCOUNTER — Other Ambulatory Visit (HOSPITAL_COMMUNITY): Payer: Self-pay

## 2021-08-01 DIAGNOSIS — I2694 Multiple subsegmental pulmonary emboli without acute cor pulmonale: Secondary | ICD-10-CM

## 2021-08-01 MED ORDER — RIVAROXABAN (XARELTO) VTE STARTER PACK (15 & 20 MG)
ORAL_TABLET | ORAL | 0 refills | Status: AC
  Filled 2021-08-01: qty 51, 30d supply, fill #0

## 2021-08-01 NOTE — TOC Benefit Eligibility Note (Signed)
Patient Product/process development scientist completed.    The patient is currently admitted and upon discharge could be taking Xarelto 20 mg.  The current 30 day co-pay is, $4.00.   The patient is insured through [facility]     Roland Earl, CPhT Pharmacy Patient Advocate Specialist [facility] Patient Advocate Team Direct Number: [phone]  Fax: [phone]

## 2021-08-01 NOTE — Progress Notes (Signed)
Mobility Specialist Progress Note:   08/01/21 1000  Mobility  Activity Ambulated in hall  Level of Assistance Modified independent, requires aide device or extra time  Assistive Device None  Distance Ambulated (ft) 480 ft  Mobility Ambulated with assistance in hallway  Mobility Response Tolerated well  Mobility performed by Mobility specialist  $Mobility charge 1 Mobility   SATURATION QUALIFICATIONS: (This note is used to comply with regulatory documentation for home oxygen)  Patient Saturations on Room Air at Rest = 100%  Patient Saturations on [place] while Ambulating = 98%-100%  Pt received in bed willing to participate in mobility. No complaints of pain. Pt stated he felt a little SOB during ambulation. Returned to EOB with call bell in reach and all needs met.   [facility] Public librarian Phone [phone] Secondary Phone [phone]

## 2021-08-01 NOTE — TOC Transition Note (Signed)
Transition of Care [facility]) - CM/SW Discharge Note   Patient Details  Name: Ian Wolf MRN: [id number] Date of Birth: 07/10/1961  Transition of Care [facility]) CM/SW Contact:  Bess Kinds, RN Phone Number: [phone] 08/01/2021, 12:30 PM   Clinical Narrative:     Spoke with patient at the bedside. Demographics confirmed. Address in Epic is one he uses for mail. Current phone number is [phone].   Patient stated that he has been staying at a shelter in [facility] [employer].Spoke with Windy Fast at [facility] [phone] who confirmed that patient's bed is being held. [facility] will dispense medications, but there is no provider.   Patient [place] then [place] bus to [place].   Discussed hospital follow up and primary care - patient not wanting TOC to schedule an appointment but was agreeable to having contact information for Primary Care [facility] and he will call himself.   Discussed substance abuse. Patient stated that he has stayed at [facility] for 20 days in the past. He states there comes a time when you have to realize time is running out and you need to give it up. Patient declined need for resources at this time.   No further TOC needs identified at this time.   Final next level of care: Homeless Shelter Barriers to Discharge: No Barriers Identified   Patient Goals and CMS Choice Patient states their goals for this hospitalization and ongoing recovery are:: go back to [email or web address].gov Compare Post Acute Care list provided to:: Patient Choice offered to / list presented to : Patient  Discharge Placement                       Discharge Plan and Services                DME Arranged: N/A DME Agency: NA       HH Arranged: NA HH Agency: NA        Social Determinants of Health (SDOH) Interventions     Readmission Risk Interventions No flowsheet data found.

## 2021-08-01 NOTE — TOC Progression Note (Signed)
Transition of Care [facility]) - Progression Note    Patient Details  Name: Ian Wolf MRN: [id number] Date of Birth: [birth year]  Transition of Care [facility]) CM/SW Contact  Ivette Loyal, [place] Phone Number: 08/01/2021, 9:41 AM  Clinical Narrative:     Transition of Care [facility]) Screening Note   Patient Details  Name: Ian Wolf Date of Birth: 1960/12/02   Transition of Care [facility]) CM/SW Contact:    Lynett Grimes Phone Number: 08/01/2021, 9:41 AM    Transition of Care [facility]) has reviewed patient and no TOC needs have been identified at this time. We will continue to monitor patient advancement through interdisciplinary progression rounds. If new patient transition needs arise, please place a TOC consult.          Expected Discharge Plan and Services           Expected Discharge Date: 07/31/21                                     Social Determinants of Health (SDOH) Interventions    Readmission Risk Interventions No flowsheet data found.

## 2021-08-01 NOTE — TOC Progression Note (Signed)
Transition of Care [facility]) - Progression Note    Patient Details  Name: Ian Wolf MRN: [id number] Date of Birth: 01-18-1961  Transition of Care [facility]) CM/SW Contact  Ivette Loyal, [place] Phone Number: 08/01/2021, 12:33 PM  Clinical Narrative:    CSW spoke with pt about transportation and where he will DC to. Pt states he will be staying at the [facility] in [facility] but wanted to go to the [place] [employer] to meet someone who will give him cash for the part bus bc he may have to go to [place] later as well. CSW provided pt with bus pass, CSW signing off.      Barriers to Discharge: No Barriers Identified  Expected Discharge Plan and Services           Expected Discharge Date: 07/31/21               DME Arranged: N/A DME Agency: NA       HH Arranged: NA HH Agency: NA         Social Determinants of Health (SDOH) Interventions    Readmission Risk Interventions No flowsheet data found.

## 2021-08-15 ENCOUNTER — Other Ambulatory Visit (HOSPITAL_COMMUNITY): Payer: Self-pay

## 2021-08-15 ENCOUNTER — Telehealth (HOSPITAL_COMMUNITY): Payer: Self-pay

## 2021-08-15 NOTE — Telephone Encounter (Signed)
I attempted to call Ian Wolf for a Pharmacy Transition of Care call. LVM.

## 2021-08-16 ENCOUNTER — Other Ambulatory Visit (HOSPITAL_COMMUNITY): Payer: Self-pay

## 2021-08-16 ENCOUNTER — Telehealth (HOSPITAL_COMMUNITY): Payer: Self-pay

## 2021-08-16 NOTE — Telephone Encounter (Signed)
Transitions of Care Pharmacy   Call attempted for a pharmacy transitions of care follow-up. HIPAA appropriate voicemail was left with call back information provided.   Call attempt #2. Will follow-up in 2-3 days.    

## 2021-08-17 ENCOUNTER — Telehealth (HOSPITAL_COMMUNITY): Payer: Self-pay

## 2021-08-17 NOTE — Telephone Encounter (Signed)
Transitions of Care Pharmacy   Call attempted for a pharmacy transitions of care follow-up. HIPAA appropriate voicemail was left with call back information provided.   Call attempt #3. Will no longer attempt follow up for TOC pharmacy.   

## 2024-05-16 ENCOUNTER — Other Ambulatory Visit (HOSPITAL_BASED_OUTPATIENT_CLINIC_OR_DEPARTMENT_OTHER): Payer: Self-pay
